# Patient Record
Sex: Female | Born: 1965
Health system: Southern US, Community
[De-identification: ages and names within clinical notes are randomized; demographics above are authoritative.]

## PROBLEM LIST (undated history)

## (undated) DIAGNOSIS — H4020X0 Unspecified primary angle-closure glaucoma, stage unspecified: Secondary | ICD-10-CM

## (undated) DIAGNOSIS — F329 Major depressive disorder, single episode, unspecified: Secondary | ICD-10-CM

## (undated) DIAGNOSIS — F32A Depression, unspecified: Secondary | ICD-10-CM

## (undated) DIAGNOSIS — Z9889 Other specified postprocedural states: Secondary | ICD-10-CM

## (undated) DIAGNOSIS — H40219 Acute angle-closure glaucoma, unspecified eye: Secondary | ICD-10-CM

## (undated) DIAGNOSIS — T7840XA Allergy, unspecified, initial encounter: Secondary | ICD-10-CM

## (undated) DIAGNOSIS — I10 Essential (primary) hypertension: Secondary | ICD-10-CM

## (undated) DIAGNOSIS — R112 Nausea with vomiting, unspecified: Secondary | ICD-10-CM

## (undated) DIAGNOSIS — E039 Hypothyroidism, unspecified: Secondary | ICD-10-CM

## (undated) HISTORY — PX: PARATHYROIDECTOMY: SHX19

## (undated) HISTORY — DX: Allergy, unspecified, initial encounter: T78.40XA

## (undated) HISTORY — DX: Major depressive disorder, single episode, unspecified: F32.9

## (undated) HISTORY — PX: INCISION AND DRAINAGE: SHX5863

## (undated) HISTORY — PX: ORIF FINGER FRACTURE: SHX2122

## (undated) HISTORY — DX: Acute angle-closure glaucoma, unspecified eye: H40.219

## (undated) HISTORY — DX: Essential (primary) hypertension: I10

## (undated) HISTORY — DX: Depression, unspecified: F32.A

## (undated) HISTORY — DX: Hypothyroidism, unspecified: E03.9

## (undated) HISTORY — DX: Unspecified primary angle-closure glaucoma, stage unspecified: H40.20X0

---

## 2008-09-08 ENCOUNTER — Ambulatory Visit (HOSPITAL_COMMUNITY): Admission: RE | Admit: 2008-09-08 | Discharge: 2008-09-08 | Payer: Self-pay | Admitting: Obstetrics and Gynecology

## 2008-09-08 ENCOUNTER — Encounter (INDEPENDENT_AMBULATORY_CARE_PROVIDER_SITE_OTHER): Payer: Self-pay | Admitting: Obstetrics and Gynecology

## 2011-02-06 NOTE — Op Note (Signed)
[name], Jacqueline Finley NO.:  [id number]   MEDICAL RECORD NO.:  [id number]          PATIENT TYPE:  AMB   LOCATION:  SDC                           FACILITY:  [facility]   PHYSICIAN:  Huel Cote, M.D. DATE OF BIRTH:  November 15, 1965   DATE OF PROCEDURE:  DATE OF DISCHARGE:                               OPERATIVE REPORT   PREOPERATIVE DIAGNOSIS:  Menorrhagia.   POSTOPERATIVE DIAGNOSIS:  Menorrhagia.   PROCEDURES:  Hysteroscopy and NovaSure endometrial ablation.   SURGEON:  Huel Cote, MD   ANESTHESIA:  LMA with a 1% plain paracervical lidocaine block.   FINDINGS:  Normal cavity.   SPECIMEN:  Endometrial curettings were sent to pathology, total cavity  width was 3.8, cavity length was 5.0, treatment time was 1 minute and 5  seconds, and a power of 105.   PROCEDURE:  The patient was taken to the operating room where LMA  anesthesia was obtained without difficulty.  She was then prepped and  draped in normal sterile fashion in the dorsal lithotomy position.  A  speculum was placed within the vagina.  Some Cyto-Tek remnants were  noted and removed and the patient's IUD was removed prior to beginning  the procedure.  The uterus was sounded to 10 and the cervix measured  approximately 5 cm with a cavity length of 5, this was verified by the  SureSound given the long cervical length.  The hysteroscope was then  easily introduced into the uterus which distended nicely and a normal  cavity was noted.  Both tubal ostia were clearly visualized and there  was no impediment to proceeding with the NovaSure.  An endometrial  curetting was then performed and the sample sent to pathology, although  the patient was at the conclusion of her period.  Endometrium did appear  rather thin.  The NovaSure was then introduced into the uterus and the  unit deployed, cavity width of 3.8 was noted with manipulation and the  cervical seal was put in place.  The uterine cavity was tested  and  passed and therefore the unit was activated with the treatment time as  stated of 1 minute and 5 seconds at a power of 105, the unit was then  removed and the hysteroscope reintroduced into the uterus.  It appear  that there was a good treatment effect with no viable appearing  endometrium noted either in the cornua or the cavity.  The patient  tolerated the procedure very well and was given 1% plain paracervical  block at the 2 and 10 o'clock position prior to beginning the procedure  to help with postoperative pain relief, as well.  Total of 15 mL were  given.  All instruments and sponges were then removed from the patient's  vagina and she was awakened and taken to the recovery room in good  condition.     Huel Cote, M.D.  Electronically Signed    KR/MEDQ  D:  09/08/2008  T:  09/08/2008  Job:  [id number]

## 2011-02-06 NOTE — H&P (Signed)
NAMERONNICA, DREESE NO.:  [id number]   MEDICAL RECORD NO.:  [id number]          PATIENT TYPE:  AMB   LOCATION:                                [facility]:  [facility]   PHYSICIAN:  Huel Cote, M.D. DATE OF BIRTH:  1966-06-10   DATE OF ADMISSION:  09/08/2008  DATE OF DISCHARGE:                              HISTORY & PHYSICAL   DATE OF SURGERY:  [facility] on September 08, 2008 at 10 o'clock  a.m.   HISTORY OF PRESENT ILLNESS:  The patient is a 45 year old G2, P2 who is  coming in for a scheduled NovaSure hysteroscopy for an ongoing problem  with menorrhagia.  The patient had a previously-placed copper IUD that  has been in since the birth of her last child.  She does note that she  has very heavy periods coming every 20-40 days and lasting 7 days in  duration.  She wishes some definitive therapy for this and is not  interested in oral contraceptives or any medications.   PAST MEDICAL HISTORY:  1. MRSA in a C-section wound infection in 1999/2000.  2. History of GERD with reflux that is stable on Nexium.  3. Most recently, she has been diagnosed with hypothyroidism and is      currently being replaced with Synthroid.   PAST SURGICAL HISTORY:  Significant for the two previous C-sections.  No  abnormal Pap smears in the past.   PAST OBSTETRICAL HISTORY:  Significant for the C-sections in 1999 and  2001.   ALLERGIES:  None.   MEDICATIONS:  1. Effexor.  2. Nexium.  3. The recent addition of Synthroid.   PHYSICAL EXAMINATION:  VITAL SIGNS:  Her weight is 148 pounds, blood  pressure 138/85.  CARDIAC:  Regular rate and rhythm.  LUNGS:  Clear.  ABDOMEN:  Soft and nontender.  PELVIC:  She has normal external genitalia.  Cervix is normal.  IUD  strings are visible.  Uterus is normal in size, and adnexa have no  masses.   The patient was counseled as to all of her options and elects to proceed  with a NovaSure endometrial ablation.  We will remove the  copper IUD at  the time of the procedure, and the patient will have hysteroscopy to  assess if the  endometrial cavity is normal.  She is aware of the risks and benefits of  the procedure including bleeding, infection and possible uterine  perforation.  She will use Cytotec prior to the procedure and hopefully  reduce the risk of perforation.  After all options were discussed with  the patient, she desires to proceed with surgery as stated.      Huel Cote, M.D.  Electronically Signed     KR/MEDQ  D:  09/06/2008  T:  09/06/2008  Job:  [id number]

## 2011-06-29 LAB — CBC
HCT: 36.8 % (ref 36.0–46.0)
MCHC: 33.9 g/dL (ref 30.0–36.0)
MCV: 84.4 fL (ref 78.0–100.0)
RBC: 4.37 MIL/uL (ref 3.87–5.11)
WBC: 4.3 10*3/uL (ref 4.0–10.5)

## 2013-09-24 DIAGNOSIS — H40219 Acute angle-closure glaucoma, unspecified eye: Secondary | ICD-10-CM

## 2013-09-24 HISTORY — DX: Acute angle-closure glaucoma, unspecified eye: H40.219

## 2013-09-28 ENCOUNTER — Encounter (HOSPITAL_COMMUNITY): Payer: Self-pay | Admitting: Emergency Medicine

## 2013-09-28 ENCOUNTER — Emergency Department (HOSPITAL_COMMUNITY)
Admission: EM | Admit: 2013-09-28 | Discharge: 2013-09-28 | Disposition: A | Discharge status: Home / Self Care | Payer: BC Managed Care – PPO | Attending: Emergency Medicine | Admitting: Emergency Medicine

## 2013-09-28 ENCOUNTER — Emergency Department (HOSPITAL_COMMUNITY): Payer: BC Managed Care – PPO

## 2013-09-28 DIAGNOSIS — Z79899 Other long term (current) drug therapy: Secondary | ICD-10-CM | POA: Insufficient documentation

## 2013-09-28 DIAGNOSIS — IMO0002 Reserved for concepts with insufficient information to code with codable children: Secondary | ICD-10-CM | POA: Insufficient documentation

## 2013-09-28 DIAGNOSIS — W208XXA Other cause of strike by thrown, projected or falling object, initial encounter: Secondary | ICD-10-CM | POA: Insufficient documentation

## 2013-09-28 DIAGNOSIS — Y929 Unspecified place or not applicable: Secondary | ICD-10-CM | POA: Insufficient documentation

## 2013-09-28 DIAGNOSIS — Z23 Encounter for immunization: Secondary | ICD-10-CM | POA: Insufficient documentation

## 2013-09-28 DIAGNOSIS — S62604B Fracture of unspecified phalanx of right ring finger, initial encounter for open fracture: Secondary | ICD-10-CM

## 2013-09-28 DIAGNOSIS — Y9301 Activity, walking, marching and hiking: Secondary | ICD-10-CM | POA: Insufficient documentation

## 2013-09-28 DIAGNOSIS — S61219A Laceration without foreign body of unspecified finger without damage to nail, initial encounter: Secondary | ICD-10-CM

## 2013-09-28 DIAGNOSIS — W010XXA Fall on same level from slipping, tripping and stumbling without subsequent striking against object, initial encounter: Secondary | ICD-10-CM | POA: Insufficient documentation

## 2013-09-28 DIAGNOSIS — S61209A Unspecified open wound of unspecified finger without damage to nail, initial encounter: Secondary | ICD-10-CM | POA: Insufficient documentation

## 2013-09-28 MED ORDER — OXYCODONE-ACETAMINOPHEN 5-325 MG PO TABS
1.0000 | ORAL_TABLET | Freq: Once | ORAL | Status: AC
  Administered 2013-09-28: 1 via ORAL
  Filled 2013-09-28: qty 1

## 2013-09-28 MED ORDER — AMOXICILLIN-POT CLAVULANATE 875-125 MG PO TABS: 1.0000 | ORAL_TABLET | Freq: Two times a day (BID) | ORAL | Status: DC

## 2013-09-28 MED ORDER — AMOXICILLIN-POT CLAVULANATE 875-125 MG PO TABS
1.0000 | ORAL_TABLET | Freq: Once | ORAL | Status: AC
  Administered 2013-09-28: 1 via ORAL
  Filled 2013-09-28: qty 1

## 2013-09-28 MED ORDER — TETANUS-DIPHTH-ACELL PERTUSSIS 5-2.5-18.5 LF-MCG/0.5 IM SUSP
0.5000 mL | Freq: Once | INTRAMUSCULAR | Status: AC
  Administered 2013-09-28: 0.5 mL via INTRAMUSCULAR
  Filled 2013-09-28: qty 0.5

## 2013-09-28 NOTE — Discharge Instructions (Signed)
Keep elevated  Take antibiotics  Return to the ED if you have any concerns  Follow-up with hand surgery tomorrow   Finger Fracture Fractures of fingers are breaks in the bones of the fingers. There are many types of fractures. There are different ways of treating these fractures, all of which can be correct. Your caregiver will discuss the best way to treat your fracture. TREATMENT  Finger fractures can be treated with:   Non-reduction - this means the bones are in place. The finger is splinted without changing the positions of the bone pieces. The splint is usually left on for about a week to ten days. This will depend on your fracture and what your caregiver thinks.  Closed reduction - the bones are put back into position without using surgery. The finger is then splinted.  ORIF (open reduction and internal fixation) - the fracture site is opened. Then the bone pieces are fixed into place with pins or some type of hardware. This is seldom required. It depends on the severity of the fracture. Your caregiver will discuss the type of fracture you have and the treatment that will be best for that problem. If surgery is the treatment of choice, the following is information for you to know and also let your caregiver know about prior to surgery. LET YOUR CAREGIVER KNOW ABOUT:  Allergies  Medications taken including herbs, eye drops, over the counter medications, and creams  Use of steroids (by mouth or creams)  Previous problems with anesthetics or Novocaine  Possibility of pregnancy, if this applies  History of blood clots (thrombophlebitis)  History of bleeding or blood problems  Previous surgery  Other health problems AFTER THE PROCEDURE After surgery, you will be taken to the recovery area where a nurse will check your progress. Once you're awake, stable, and taking fluids well, barring other problems you will be allowed to go home. Once home an ice pack applied to your operative  site may help with discomfort and keep the swelling down. HOME CARE INSTRUCTIONS   Follow your caregiver's instructions as to activities, exercises, physical therapy, and driving a car.  Use your finger and exercise as directed.  Only take over-the-counter or prescription medicines for pain, discomfort, or fever as directed by your caregiver. Do not take aspirin until your caregiver OK's it, as this can increase bleeding immediately following surgery.  Stop using ibuprofen if it upsets your stomach. Let your caregiver know about it. SEEK MEDICAL CARE IF:  You have increased bleeding (more than a small spot) from the wound or from beneath your splint.  You develop redness, swelling, or increasing pain in the wound or from beneath your splint.  There is pus coming from the wound or from beneath your splint.  An unexplained oral temperature above 102 F (38.9 C) develops, or as your caregiver suggests.  There is a foul smell coming from the wound or dressing or from beneath your splint. SEEK IMMEDIATE MEDICAL CARE IF:   You develop a rash.  You have difficulty breathing.  You have any allergic problems. MAKE SURE YOU:   Understand these instructions.  Will watch your condition.  Will get help right away if you are not doing well or get worse. Document Released: 12/23/2000 Document Revised: 12/03/2011 Document Reviewed: 04/29/2008 [facility] Patient Information 2014 [place], [place].

## 2013-09-28 NOTE — ED Notes (Signed)
Pt c/o fourth digit on right hand laceration after fall; pt unsure if could be fractured; painful to move

## 2013-09-28 NOTE — ED Provider Notes (Signed)
CSN: [id number]     Arrival date & time 09/28/13  1817 History   First MD Initiated Contact with Patient 09/28/13 1944     This chart was scribed for non-physician practitioner, Vernie Murders PA-C working with Orpah Greek, * by Forrestine Him, ED Scribe. This patient was seen in room TR08C/TR08C and the patient's care was started at 7:55 PM.   Chief Complaint  Patient presents with  . Finger Injury   The history is provided by the patient. No language interpreter was used.    HPI Comments: Jacqueline Finley is a 48 y.o. female who presents to the Emergency Department complaining of a finger injury.  Patient states that prior to arrival she was walking with a crock pot of soup when she slipped and fell with the crock pot falling on her right hand.  She denies any other injuries including head injury or LOC.  She sustained a laceration to her right 4th digit.  Patient took a shower to clean herself from the soup but also cleaned her wound.  She did not take anything for pain PTA.  No numbness, tingling, loss of sensation, or weakness.  She has limitations of ROM due to pain.  Tetanus not up to date.    History reviewed. No pertinent past medical history. History reviewed. No pertinent past surgical history. History reviewed. No pertinent family history. History  Substance Use Topics  . Smoking status: Never Smoker   . Smokeless tobacco: Not on file  . Alcohol Use: Yes     Comment: occ   OB History   Grav Para Term Preterm Abortions TAB SAB Ect Mult Living                 Review of Systems  Constitutional: Negative for diaphoresis.  Respiratory: Negative for shortness of breath.   Cardiovascular: Negative for chest pain and leg swelling.  Gastrointestinal: Negative for nausea, vomiting and abdominal pain.  Musculoskeletal: Negative for back pain and neck pain.  Skin: Positive for wound (Positive for laceration ).  Neurological: Negative for syncope, weakness and headaches.   Psychiatric/Behavioral: Negative for confusion.  All other systems reviewed and are negative.    Allergies  Review of patient's allergies indicates no known allergies.  Home Medications  No current outpatient prescriptions on file.  Triage Vitals: BP 153/105  Pulse 82  Temp(Src) 98.4 F (36.9 C) (Oral)  Resp 18  Wt 155 lb (70.308 kg)  SpO2 95%  LMP 09/24/2008  Filed Vitals:   09/28/13 1822 09/28/13 2322  BP: 153/105 130/85  Pulse: 82 75  Temp: 98.4 F (36.9 C) 98 F (36.7 C)  TempSrc: Oral Oral  Resp: 18   Weight: 155 lb (70.308 kg)   SpO2: 95% 100%    Physical Exam  Nursing note and vitals reviewed. Constitutional: She is oriented to person, place, and time. She appears well-developed and well-nourished. No distress.  HENT:  Head: Normocephalic and atraumatic.  Right Ear: External ear normal.  Left Ear: External ear normal.  Nose: Nose normal.  Mouth/Throat: Oropharynx is clear and moist.  No tenderness to the scalp or face throughout. No palpable hematoma, step-offs, or lacerations throughout.     Eyes: Conjunctivae are normal. Right eye exhibits no discharge. Left eye exhibits no discharge.  Neck: Normal range of motion. Neck supple.  No cervical spinal or paraspinal tenderness to palpation throughout.  No limitations with neck ROM.    Cardiovascular: Normal rate, regular rhythm and normal heart sounds.  Exam reveals no gallop and no friction rub.   No murmur heard. Radial pulses present bilaterally.  Capillary refill <2 seconds on digits of right hand.    Pulmonary/Chest: Effort normal and breath sounds normal. No respiratory distress. She has no wheezes. She has no rales. She exhibits no tenderness.  Abdominal: Soft. She exhibits no distension. There is no tenderness.  Musculoskeletal: Normal range of motion. She exhibits edema and tenderness.       Hands: Tenderness to palpation to the right 4th phalanx throughout.  ROM of the PIP and DIP joints of the  right 4th phalanx limited due to pain however is intact.  ROM of the digits 1,2,3, & 5 of the right hand intact.  No tenderness to palpation to the right wrist.     Neurological: She is alert and oriented to person, place, and time.  Sensation intact in the right hand  Skin: Skin is warm and dry. She is not diaphoretic.  2 cm laceration to the lateral proximal and middle phalanx overlying the PIP joint.  Bleeding well controlled.  Surrounding edema.  No ecchymosis.  No penetrating or palpable bone.      ED Course  LACERATION REPAIR Date/Time: 09/28/2013 10:00 PM Performed by: Vernie Murders K Authorized by: Lucila [place] Consent: Verbal consent obtained. Consent given by: patient Patient identity confirmed: verbally with patient Body area: upper extremity Location details: right ring finger Laceration length: 2 cm Foreign bodies: no foreign bodies Tendon involvement: none Nerve involvement: none Vascular damage: no Anesthesia: local infiltration and digital block Local anesthetic: lidocaine 1% without epinephrine Anesthetic total: 6 ml Patient sedated: no Preparation: Patient was prepped and draped in the usual sterile fashion. Irrigation solution: saline Irrigation method: jet lavage Amount of cleaning: standard Debridement: none Degree of undermining: none Skin closure: Ethilon Number of sutures: 3 Technique: simple Approximation: loose Approximation difficulty: simple Dressing: 4x4 sterile gauze Patient tolerance: Patient tolerated the procedure well with no immediate complications. Comments: Ring tourniquet used due to bleeding after irrigation   (including critical care time)  DIAGNOSTIC STUDIES: Oxygen Saturation is 95% on RA, adequate by my interpretation.    COORDINATION OF CARE: 7:55 PM- Will perform laceration repair. Discussed treatment plan with pt at bedside and pt agreed to plan.     Labs Review Labs Reviewed - No data to display Imaging Review Dg  Finger Ring Right  09/28/2013   CLINICAL DATA:  Fall, trauma, finger injury, pain, deformity  EXAM: RIGHT RING FINGER 2+V  COMPARISON:  None  FINDINGS: Osseous mineralization grossly normal.  Comminuted fracture middle phalanx right ring finger, extending into DIP and PIP joints.  Displaced fracture fragment identified at the base of the middle phalanx.  Twin no additional fracture, dislocation or bone destruction.  IMPRESSION: Comminuted displaced intra-articular fracture involving the middle phalanx of the right ring finger, extending into the DIP and PIP joints.   Electronically Signed   By: Lavonia Dana M.D.   On: 09/28/2013 19:09    EKG Interpretation   None           DG Finger Ring Right (Final result)  Result time: 09/28/13 19:09:42    Final result by Rad Results In Interface (09/28/13 19:09:42)    Narrative:   CLINICAL DATA: Fall, trauma, finger injury, pain, deformity  EXAM: RIGHT RING FINGER 2+V  COMPARISON: None  FINDINGS: Osseous mineralization grossly normal.  Comminuted fracture middle phalanx right ring finger, extending into DIP and PIP joints.  Displaced fracture fragment  identified at the base of the middle phalanx.  Twin no additional fracture, dislocation or bone destruction.  IMPRESSION: Comminuted displaced intra-articular fracture involving the middle phalanx of the right ring finger, extending into the DIP and PIP joints.   Electronically Signed By: Lavonia Dana M.D. On: 09/28/2013 19:09         MDM   Jacqueline Finley is a 48 y.o. female female who presents to the Emergency Department complaining of a finger injury.     Consults  9:30 PM = Spoke with Dr. Burney Gauze who states to irrigate wound and apply loose sutures with overlying ulnar gutter splint.  Will see in clinic tomorrow and surgery likely later this week.  Start Augmentin.       Patient found to have a comminuted displaced intra-articular fracture of the middle phalanx of the right  ring finger with an overlying laceration.  Hand surgery consulted.  Laceration repaired.  Tetanus booster given.  Pain well controlled.  Patient placed un ulnar gutter splint and will see hand surgery tomorrow for further evaluation and management.  Patient neurovascularly intact.  Return precautions, discharge instructions, and follow-up was discussed with the patient before discharge.     Discharge Medication List as of 09/28/2013 11:17 PM    START taking these medications   Details  amoxicillin-clavulanate (AUGMENTIN) 875-125 MG per tablet Take 1 tablet by mouth every 12 (twelve) hours., Starting 09/28/2013, Until Discontinued, Print        Final impressions: 1. Fracture of phalanx of right ring finger, open, initial encounter   2. Laceration of finger, right, initial encounter      Mercy Moore PA-C   This patient was discussed with Dr. Doristine Counter, PA-C 09/30/13 1026

## 2013-10-02 NOTE — ED Provider Notes (Signed)
Medical screening examination/treatment/procedure(s) were performed by non-physician practitioner and as supervising physician I was immediately available for consultation/collaboration.  Meghana Tullo J. Nykolas Bacallao, MD 10/02/13 0758 

## 2013-11-15 DIAGNOSIS — H40241 Residual stage of angle-closure glaucoma, right eye: Secondary | ICD-10-CM | POA: Insufficient documentation

## 2014-06-21 ENCOUNTER — Ambulatory Visit: Payer: BC Managed Care – PPO | Admitting: Family

## 2014-10-20 ENCOUNTER — Encounter: Payer: Self-pay | Admitting: Family Medicine

## 2014-10-20 ENCOUNTER — Ambulatory Visit (INDEPENDENT_AMBULATORY_CARE_PROVIDER_SITE_OTHER): Payer: BLUE CROSS/BLUE SHIELD | Admitting: Family Medicine

## 2014-10-20 VITALS — BP 120/82 | HR 69 | Temp 97.7°F | Ht 64.75 in | Wt 169.3 lb

## 2014-10-20 DIAGNOSIS — F329 Major depressive disorder, single episode, unspecified: Secondary | ICD-10-CM

## 2014-10-20 DIAGNOSIS — E039 Hypothyroidism, unspecified: Secondary | ICD-10-CM

## 2014-10-20 DIAGNOSIS — E663 Overweight: Secondary | ICD-10-CM

## 2014-10-20 DIAGNOSIS — R5383 Other fatigue: Secondary | ICD-10-CM

## 2014-10-20 DIAGNOSIS — F32A Depression, unspecified: Secondary | ICD-10-CM | POA: Insufficient documentation

## 2014-10-20 LAB — TSH: TSH: 2.14 u[IU]/mL (ref 0.35–4.50)

## 2014-10-20 MED ORDER — VENLAFAXINE HCL ER 75 MG PO CP24: 75.0000 mg | ORAL_CAPSULE | Freq: Every evening | ORAL | Status: DC | PRN

## 2014-10-20 MED ORDER — VENLAFAXINE HCL ER 150 MG PO CP24: 150.0000 mg | ORAL_CAPSULE | Freq: Every day | ORAL | Status: DC

## 2014-10-20 NOTE — Progress Notes (Signed)
HPI:  Jacqueline Finley is here to establish care. Used to see an endocrinologist in [place] for her hypothyroidism - but now works in [employer] and wants to transfer here. On synthroid 126mg for some time and does ok on this. On cytomel shortly in the past. She feels like thryoid might be a little off - wants to check. She is active - exercises 5-6 days per week -runs, does hot yoga. Has been 2 years since her last thyroid check. Feels like her weight has been more difficult to control.  Last PCP and physical: goes to ob/gyn - goes yearly usually, but did not go this past year.   Has the following chronic problems that require follow up and concerns today:  Hypothyroidism: -no hx of thyroid goiter or ca -on synthroid 1042m, but last check in 2013 -symptoms: fatigue, struggles with weight -[place]takes on empty stomach  GERD: -reflux and heartburn a few times per month -denies: dysphagia, nausea, vomiting -response well to the nexium - TUMs work too -hx gastric ulcer from motrin  Hx glaucoma: -from scopolamine patch -sees Dr. 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 eye  Osteoarthritis: -of hands -takes a lot of motrin for this  Depression: -hx of after a tough period in her life in 2000 -takes Effexor xr 150 bid - above max dose; she if ok with coming down to normal max dose but not below -used to see pzych -doing well, wishes to continue  HM: -has not done colonoscopy  -has not done mammo    ROS negative for unless reported above: fevers, unintentional weight loss, hearing or vision loss, chest pain, palpitations, struggling to breath, hemoptysis, melena, hematochezia, hematuria, falls, loc, si, thoughts of self harm  Past Medical History  Diagnosis Date  . Hypothyroidism   . Angle-closure glaucoma   . Depression     Past Surgical History  Procedure Laterality Date  . Cesarean section    . Incision  and drainage      due to C-section  . Orif finger fracture Right     4th digit    No family history on file.  History   Social History  . Marital Status: Married    Spouse Name: N/A    Number of Children: N/A  . Years of Education: N/A   Social History Main Topics  . Smoking status: Never Smoker   . Smokeless tobacco: None  . Alcohol Use: Yes     Comment: occ  . Drug Use: No  . Sexual Activity: None   Other Topics Concern  . None   Social History Narrative     Current outpatient prescriptions:  .  Probiotic Product (PROBIOTIC PO), Take 1 tablet by mouth daily., Disp: , Rfl:  .  SYNTHROID 100 MCG tablet, Take 1 tablet by mouth daily., Disp: , Rfl:  .  venlafaxine XR (EFFEXOR-XR) 150 MG 24 hr capsule, Take 1 capsule (150 mg total) by mouth daily with breakfast., Disp: 90 capsule, Rfl: 1 .  venlafaxine XR (EFFEXOR-XR) 75 MG 24 hr capsule, Take 1 capsule (75 mg total) by mouth at bedtime as needed., Disp: 90 capsule, Rfl: 1  EXAM:  Filed Vitals:   10/20/14 1130  BP: 120/82  Pulse: 69  Temp: 97.7 F (36.5 C)    Body mass index is 28.38 kg/(m^2).  GENERAL: vitals reviewed and listed above, alert, oriented, appears well hydrated and in no acute distress  HEENT: atraumatic, conjunttiva clear, no obvious abnormalities on inspection of external nose and ears  NECK: no obvious masses on inspection  LUNGS: clear to auscultation bilaterally, no wheezes, rales or rhonchi, good air movement  CV: HRRR, no peripheral edema  MS: moves all extremities without noticeable abnormality  PSYCH: pleasant and cooperative, no obvious depression or anxiety  ASSESSMENT AND PLAN:  Discussed the following assessment and plan:  Hypothyroidism, unspecified hypothyroidism type - Plan: TSH  Overweight  Other fatigue - Plan: CMP with eGFR  Depression - Plan: venlafaxine XR (EFFEXOR-XR) 150 MG 24 hr capsule, venlafaxine XR (EFFEXOR-XR) 75 MG 24 hr capsule  -basic labs per her  request - not fasting so did not do cholesterol -she may see the endocrinologist but we will see where her labs are -advised decreasing her effexor to normal dose and she agreed to this and may see psych if does not tolerate this -We reviewed the PMH, PSH, FH, SH, Meds and Allergies. -We provided refills for any medications we will prescribe as needed. -We addressed current concerns per orders and patient instructions. -We have asked for records for pertinent exams, studies, vaccines and notes from previous providers. -We have advised patient to follow up per instructions below.   -Patient advised to return or notify a doctor immediately if symptoms worsen or persist or new concerns arise.  Patient Instructions  -We have ordered labs or studies at this visit. It can take up to 1-2 weeks for results and processing. We will contact you with instructions IF your results are abnormal. Normal results will be released to your [facility]. If you have not heard from [place] or can not find your results in [facility] in 2 weeks please contact our office.  -PLEASE SIGN UP FOR MYCHART TODAY   We recommend the following healthy lifestyle measures: - eat a healthy diet consisting of lots of vegetables, fruits, beans, nuts, seeds, healthy meats such as white chicken and fish and whole grains.  - avoid fried foods, fast food, processed foods, sodas, red meet and other fattening foods.  - get a least 150 minutes of aerobic exercise per week.   Decrease Effexor down to 150 in morning and 75 at night  Get a mammogram  Call if you decide to do the colonoscopy      Shavanna Furnari R.

## 2014-10-20 NOTE — Progress Notes (Signed)
Pre visit review using our clinic review tool, if applicable. No additional management support is needed unless otherwise documented below in the visit note. 

## 2014-10-20 NOTE — Patient Instructions (Addendum)
-  We have ordered labs or studies at this visit. It can take up to 1-2 weeks for results and processing. We will contact you with instructions IF your results are abnormal. Normal results will be released to your [facility]. If you have not heard from [place] or can not find your results in [facility] in 2 weeks please contact our office.  -PLEASE SIGN UP FOR MYCHART TODAY   We recommend the following healthy lifestyle measures: - eat a healthy diet consisting of lots of vegetables, fruits, beans, nuts, seeds, healthy meats such as white chicken and fish and whole grains.  - avoid fried foods, fast food, processed foods, sodas, red meet and other fattening foods.  - get a least 150 minutes of aerobic exercise per week.   Decrease Effexor down to 150 in morning and 75 at night  Get a mammogram  Call if you decide to do the colonoscopy

## 2014-10-21 LAB — COMPLETE METABOLIC PANEL WITH GFR
ALBUMIN: 4.4 g/dL (ref 3.5–5.2)
ALT: 19 U/L (ref 0–35)
AST: 15 U/L (ref 0–37)
Alkaline Phosphatase: 46 U/L (ref 39–117)
BUN: 17 mg/dL (ref 6–23)
CALCIUM: 11.2 mg/dL — AB (ref 8.4–10.5)
CO2: 25 meq/L (ref 19–32)
CREATININE: 0.54 mg/dL (ref 0.50–1.10)
Chloride: 104 mEq/L (ref 96–112)
GFR, Est African American: >89 mL/min
GLUCOSE: 88 mg/dL (ref 70–99)
POTASSIUM: 5.2 meq/L (ref 3.5–5.3)
Sodium: 138 mEq/L (ref 135–145)
Total Bilirubin: 0.3 mg/dL (ref 0.2–1.2)
Total Protein: 6.9 g/dL (ref 6.0–8.3)

## 2014-10-25 ENCOUNTER — Telehealth: Payer: Self-pay | Admitting: Family Medicine

## 2014-10-25 ENCOUNTER — Other Ambulatory Visit (INDEPENDENT_AMBULATORY_CARE_PROVIDER_SITE_OTHER): Payer: BLUE CROSS/BLUE SHIELD

## 2014-10-25 ENCOUNTER — Other Ambulatory Visit: Payer: Self-pay | Admitting: *Deleted

## 2014-10-25 DIAGNOSIS — E038 Other specified hypothyroidism: Secondary | ICD-10-CM

## 2014-10-25 MED ORDER — LEVOTHYROXINE SODIUM 100 MCG PO TABS: 100.0000 ug | ORAL_TABLET | Freq: Every day | ORAL | Status: DC

## 2014-10-25 NOTE — Telephone Encounter (Addendum)
[phone] (home)  Discussed with pt. She wants to increase synthroid due to fatigue. I called to ask what questions she had and she raised her voice and demanded that I increase her synthroid as she is having difficulty loosing weight. I advised given her elevated calcium would advise finding out what is going with the calcium first. She has reported hx of elevated calcium without eval. She had not see a doctor in several years prior to coming to me. Opted to wait on calcium/ipth results and then likely have her see endocrinologist to assist. She does not want to see Dr. Buddy Duty, she reports wants to see [facility] [facility] endo.

## 2014-10-25 NOTE — Telephone Encounter (Signed)
Pt called to ask if the following rx has been called in  SYNTHROID 100 MCG tablet   Pt said her and Dr Maudie Mercury discussed her medicine being up [address]

## 2014-10-26 ENCOUNTER — Encounter: Payer: Self-pay | Admitting: Family Medicine

## 2014-10-26 ENCOUNTER — Telehealth: Payer: Self-pay | Admitting: Family Medicine

## 2014-10-26 LAB — CALCIUM, IONIZED: Calcium, Ion: 1.5 mmol/L — ABNORMAL HIGH (ref 1.12–1.32)

## 2014-10-26 NOTE — Telephone Encounter (Signed)
Results for the CMP were discussed with pt on 2/1 and released via Mychart today.

## 2014-10-26 NOTE — Telephone Encounter (Signed)
Pt would like blood work results °

## 2014-10-27 ENCOUNTER — Telehealth: Payer: Self-pay | Admitting: Family Medicine

## 2014-10-27 LAB — PTH, INTACT AND CALCIUM
Calcium: 10.5 mg/dL (ref 8.4–10.5)
PTH: 79 pg/mL — AB (ref 14–64)

## 2014-10-27 NOTE — Telephone Encounter (Signed)
Waiting on ipth - Jacqueline Finley anee can you check with lab on how long this takes and let pt know. Thanks. If she would like referral to endocrine in interim please place referral.

## 2014-10-27 NOTE — Telephone Encounter (Signed)
Pt called to say that the wrong information was sent over to [facility]. She is requesting that Dr Maudie Mercury give her a call . She said she is not going to [facility] for the reason that was submitted

## 2014-10-27 NOTE — Telephone Encounter (Signed)
I called the pt and she was given the results.

## 2014-10-27 NOTE — Telephone Encounter (Signed)
Pt would like pth and calcium results

## 2014-10-27 NOTE — Telephone Encounter (Signed)
Patient informed of the results

## 2014-10-28 NOTE — Telephone Encounter (Signed)
Called patient and LMOVM for her. I explained that the two dx submitted with her referral will stand as that is what Dr Maudie Mercury has dx'd. To further expound on her condition she will need to proceed with her appointment at [facility].

## 2014-10-28 NOTE — Telephone Encounter (Addendum)
Per my assistant pt has been on the phone with referral coordinator for excessive amount of time, demanded to see one specialist at [facility], but was not happy would have to wait to see that specialist, so they scheduled her with another provider at that clinic. Thinks if we change dx will change referral. Per my referral coordinator, she called the clinic and they advised appt per what is scheduled and advised pt can call and make changes in terms of provider or time of appt if needed and per referral coordinator pt informed of this. Please advise her hypercalcemia and hypothyroidism - changing dx will not likely change referral. Endocrinologist can confirm if has hyperparathyroidism or familial hypercalcemia and assist her in treatment.

## 2014-10-28 NOTE — Telephone Encounter (Signed)
Patient requests Dr Maudie Mercury call her as she states she needs to see an endocrinologist for hyperparathyroidism.

## 2014-10-28 NOTE — Telephone Encounter (Signed)
I am not sure what the question is. Can you please find out from Jacqueline Finley. Looks like appt scheduled. I thought she was going to see endo here?

## 2014-11-03 ENCOUNTER — Telehealth: Payer: Self-pay | Admitting: Family Medicine

## 2014-11-03 DIAGNOSIS — IMO0002 Reserved for concepts with insufficient information to code with codable children: Secondary | ICD-10-CM | POA: Insufficient documentation

## 2014-11-03 NOTE — Telephone Encounter (Signed)
Plan that we discussed and is on her patient instructions is 150 in the morning and 75 in the evening (max dose for her dx.). These were both sent [facility] at her visit. Please let her know and check with pharmacy if they did not get the rx.

## 2014-11-03 NOTE — Telephone Encounter (Signed)
I called the pharmacy and spoke with [facility] and she stated they did not receive the Rx that was sent in on 1/27 so I called this in to her and the pt was informed.

## 2014-11-03 NOTE — Telephone Encounter (Signed)
Pt called to say that this med was called in  venlafaxine XR (EFFEXOR-XR) 150 MG 24 hr capsule she said she is taking 225    So see need the following called in  venlafaxine XR (EFFEXOR-XR) 75 MG 24 hr capsule    Pharmacy  [facility] [address]

## 2014-11-04 DIAGNOSIS — E210 Primary hyperparathyroidism: Secondary | ICD-10-CM | POA: Insufficient documentation

## 2014-11-05 ENCOUNTER — Telehealth: Payer: Self-pay | Admitting: Family Medicine

## 2014-11-05 NOTE — Telephone Encounter (Signed)
I left a message for the pt to return my call. 

## 2014-11-05 NOTE — Telephone Encounter (Signed)
Advise the urine be done at [facility] as our lab can't take outside orders. Thank you.

## 2014-11-05 NOTE — Telephone Encounter (Signed)
Rx from 1/27 re-called in to [facility] and this was left on the voicemail and I called the pt and informed her of this.  She also stated the specialist from [place] ordered a 24-hour urine and wanted to know if she could have this done here?

## 2014-11-05 NOTE — Telephone Encounter (Signed)
Pt called to say that she did not get the correct rx she said they gave her regular instead of the following  venlafaxine XR (EFFEXOR-XR) 75 MG 24 hr capsule  She asked that the above one be called in

## 2014-11-09 ENCOUNTER — Other Ambulatory Visit: Payer: Self-pay | Admitting: Endocrinology

## 2014-11-09 ENCOUNTER — Ambulatory Visit
Admission: RE | Admit: 2014-11-09 | Discharge: 2014-11-09 | Disposition: A | Discharge status: Home / Self Care | Payer: BLUE CROSS/BLUE SHIELD | Source: Ambulatory Visit | Attending: Endocrinology | Admitting: Endocrinology

## 2014-11-09 DIAGNOSIS — R109 Unspecified abdominal pain: Secondary | ICD-10-CM

## 2014-11-09 DIAGNOSIS — E209 Hypoparathyroidism, unspecified: Secondary | ICD-10-CM

## 2014-11-10 NOTE — Telephone Encounter (Signed)
Left a message for the pt to return my call.  

## 2014-11-15 ENCOUNTER — Ambulatory Visit
Admission: RE | Admit: 2014-11-15 | Discharge: 2014-11-15 | Disposition: A | Discharge status: Home / Self Care | Payer: BLUE CROSS/BLUE SHIELD | Source: Ambulatory Visit | Attending: Endocrinology | Admitting: Endocrinology

## 2014-11-15 DIAGNOSIS — E209 Hypoparathyroidism, unspecified: Secondary | ICD-10-CM

## 2015-01-20 DIAGNOSIS — E892 Postprocedural hypoparathyroidism: Secondary | ICD-10-CM | POA: Insufficient documentation

## 2015-01-20 DIAGNOSIS — Z9889 Other specified postprocedural states: Secondary | ICD-10-CM | POA: Insufficient documentation

## 2015-11-24 ENCOUNTER — Other Ambulatory Visit: Payer: Self-pay | Admitting: Gynecology

## 2015-11-24 DIAGNOSIS — R928 Other abnormal and inconclusive findings on diagnostic imaging of breast: Secondary | ICD-10-CM

## 2015-12-02 ENCOUNTER — Ambulatory Visit
Admission: RE | Admit: 2015-12-02 | Discharge: 2015-12-02 | Disposition: A | Discharge status: Home / Self Care | Payer: BLUE CROSS/BLUE SHIELD | Source: Ambulatory Visit | Attending: Gynecology | Admitting: Gynecology

## 2015-12-02 DIAGNOSIS — R928 Other abnormal and inconclusive findings on diagnostic imaging of breast: Secondary | ICD-10-CM

## 2017-02-04 MED FILL — SYNTHROID 112 MCG TABLET: 112 | 90 days supply | Qty: 90 | Fill #0

## 2017-02-04 MED FILL — VENLAFAXINE HCL ER 150 MG C: 150 | 30 days supply | Qty: 60 | Fill #0

## 2017-02-04 MED FILL — HYDROCHLOROTHIAZIDE 12.5 MG: 12.5 | 90 days supply | Qty: 90 | Fill #0

## 2017-03-25 MED FILL — VENLAFAXINE HCL ER 150 MG C: 150 | 30 days supply | Qty: 60 | Fill #1

## 2017-04-25 ENCOUNTER — Encounter: Payer: Self-pay | Admitting: Emergency Medicine

## 2017-04-25 ENCOUNTER — Telehealth: Payer: Self-pay | Admitting: Emergency Medicine

## 2017-04-25 NOTE — Telephone Encounter (Signed)
PreVisit call attempted left voicemail for pt to return call to office.

## 2017-04-25 NOTE — Telephone Encounter (Signed)
Pre Visit call completed. 

## 2017-04-26 ENCOUNTER — Ambulatory Visit (INDEPENDENT_AMBULATORY_CARE_PROVIDER_SITE_OTHER): Payer: 59 | Admitting: Family Medicine

## 2017-04-26 ENCOUNTER — Encounter: Payer: Self-pay | Admitting: Family Medicine

## 2017-04-26 ENCOUNTER — Ambulatory Visit: Payer: BLUE CROSS/BLUE SHIELD | Admitting: Family Medicine

## 2017-04-26 VITALS — BP 128/86 | Temp 97.6°F | Ht 64.75 in | Wt 156.4 lb

## 2017-04-26 DIAGNOSIS — Z13220 Encounter for screening for lipoid disorders: Secondary | ICD-10-CM | POA: Diagnosis not present

## 2017-04-26 DIAGNOSIS — E538 Deficiency of other specified B group vitamins: Secondary | ICD-10-CM

## 2017-04-26 DIAGNOSIS — E210 Primary hyperparathyroidism: Secondary | ICD-10-CM

## 2017-04-26 DIAGNOSIS — E039 Hypothyroidism, unspecified: Secondary | ICD-10-CM | POA: Diagnosis not present

## 2017-04-26 DIAGNOSIS — E559 Vitamin D deficiency, unspecified: Secondary | ICD-10-CM

## 2017-04-26 DIAGNOSIS — R5383 Other fatigue: Secondary | ICD-10-CM | POA: Diagnosis not present

## 2017-04-26 LAB — COMPREHENSIVE METABOLIC PANEL
ALT: 19 U/L (ref 0–35)
AST: 18 U/L (ref 0–37)
Albumin: 4.1 g/dL (ref 3.5–5.2)
Alkaline Phosphatase: 37 U/L — ABNORMAL LOW (ref 39–117)
BUN: 14 mg/dL (ref 6–23)
CO2: 24 mEq/L (ref 19–32)
Calcium: 9.1 mg/dL (ref 8.4–10.5)
Chloride: 106 mEq/L (ref 96–112)
Creatinine, Ser: 0.54 mg/dL (ref 0.40–1.20)
GFR: 125.42 mL/min (ref >60.00)
Glucose, Bld: 132 mg/dL — ABNORMAL HIGH (ref 70–99)
Potassium: 4.2 mEq/L (ref 3.5–5.1)
Sodium: 137 mEq/L (ref 135–145)
Total Bilirubin: 0.4 mg/dL (ref 0.2–1.2)
Total Protein: 6.8 g/dL (ref 6.0–8.3)

## 2017-04-26 LAB — CBC WITH DIFFERENTIAL/PLATELET
Basophils Absolute: 0 10*3/uL (ref 0.0–0.1)
Basophils Relative: 0.9 % (ref 0.0–3.0)
Eosinophils Absolute: 0.1 10*3/uL (ref 0.0–0.7)
Eosinophils Relative: 1.6 % (ref 0.0–5.0)
HCT: 42 % (ref 36.0–46.0)
Hemoglobin: 14.1 g/dL (ref 12.0–15.0)
Lymphocytes Relative: 36 % (ref 12.0–46.0)
Lymphs Abs: 1.5 10*3/uL (ref 0.7–4.0)
MCHC: 33.5 g/dL (ref 30.0–36.0)
MCV: 85.6 fl (ref 78.0–100.0)
Monocytes Absolute: 0.4 10*3/uL (ref 0.1–1.0)
Monocytes Relative: 8.7 % (ref 3.0–12.0)
Neutro Abs: 2.3 10*3/uL (ref 1.4–7.7)
Neutrophils Relative %: 52.8 % (ref 43.0–77.0)
Platelets: 343 10*3/uL (ref 150.0–400.0)
RBC: 4.91 Mil/uL (ref 3.87–5.11)
RDW: 13.2 % (ref 11.5–15.5)
WBC: 4.3 10*3/uL (ref 4.0–10.5)

## 2017-04-26 LAB — VITAMIN B12: Vitamin B-12: 585 pg/mL (ref 211–911)

## 2017-04-26 LAB — T4, FREE: Free T4: 0.88 ng/dL (ref 0.60–1.60)

## 2017-04-26 LAB — VITAMIN D 25 HYDROXY (VIT D DEFICIENCY, FRACTURES): VITD: 47.13 ng/mL (ref 30.00–100.00)

## 2017-04-26 LAB — TSH: TSH: 1.03 u[IU]/mL (ref 0.35–4.50)

## 2017-04-26 LAB — T3, FREE: T3, Free: 3.4 pg/mL (ref 2.3–4.2)

## 2017-04-26 MED FILL — VENLAFAXINE HCL ER 150 MG C: 150 | 30 days supply | Qty: 60 | Fill #2

## 2017-04-26 NOTE — Progress Notes (Signed)
Jacqueline Finley is a 51 y.o. female is here to Jacqueline Finley.   History of Present Illness:   Jacqueline Finley CMA acting as scribe for Dr. Juleen Finley.  HPI: Patient comes in today to establish care with doctor Jacqueline Finley. 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. She is needing labs for her thyroid. She has had surgery for nodules on the parathyroid.   Stress: Patient is under a lot of stress due to her sons. One of her sons is supposed to go to rehab today, but did not come home.   Depression: Hx of the same and on Effexor at high dose for many years. Originally prescribed by PCP and then increased by Psychiatry. Wants to remain on that dose.   Health Maintenance Due  Topic Date Due  . Hepatitis C Screening  06-14-1966  . HIV Screening  02/15/1979  . MAMMOGRAM  02/14/2014  . COLONOSCOPY  02/14/2014  . PAP SMEAR  07/25/2016  . INFLUENZA VACCINE  04/24/2017   PMHx, SurgHx, SocialHx, Medications, and Allergies were reviewed in the Visit Navigator and updated as appropriate.   Past Medical History:  Diagnosis Date  . Acute angle-closure glaucoma 2015  . Angle-closure glaucoma   . Depression   . Hypothyroidism    Past Surgical History:  Procedure Laterality Date  . CESAREAN SECTION    . INCISION AND DRAINAGE     due to C-section  . ORIF FINGER FRACTURE Right    4th digit   No family history on file. Social History  Substance Use Topics  . Smoking status: Never Smoker  . Smokeless tobacco: Never Used  . Alcohol use Yes     Comment: occ   Current Medications and Allergies:   .  fluticasone (FLONASE) 50 MCG/ACT nasal spray, Place into the nose., Disp: , Rfl:  .  hydrochlorothiazide (MICROZIDE) 12.5 MG capsule, take 1 capsule by mouth once daily, Disp: , Rfl:  .  ibuprofen (ADVIL,MOTRIN) 200 MG tablet, Take by mouth., Disp: , Rfl:  .  levothyroxine (SYNTHROID) 112 MCG tablet, take 1 tablet by mouth every morning ON AN EMPTY STOMACH WITH A GLASS OF WATER AT LEAST 30 TO 60 MINUTES  BEFORE BREAKFAST, Disp: , Rfl:  .  venlafaxine XR (EFFEXOR-XR) 150 MG 24 hr capsule, Take by mouth., Disp: , Rfl:   Allergies  Allergen Reactions  . Morphine Nausea Only  . Scopolamine Other (See Comments)    Accute angle closure glaucoma   Review of Systems:   Pertinent items are noted in the HPI. Otherwise, ROS is negative.  Vitals:   Vitals:   04/26/17 0925  BP: 128/86  Temp: 97.6 F (36.4 C)  TempSrc: Oral  Weight: 156 lb 6.4 oz (70.9 kg)  Height: 5' 4.75" (1.645 m)     Body mass index is 26.23 kg/m.  Physical Exam:   Physical Exam  Constitutional: She appears well-developed and well-nourished. No distress.  HENT:  Head: Normocephalic and atraumatic.  Eyes: Pupils are equal, round, and reactive to light. EOM are normal.  Neck: Normal range of motion. Neck supple.  Cardiovascular: Normal rate, regular rhythm, normal heart sounds and intact distal pulses.   Pulmonary/Chest: Effort normal.  Abdominal: Soft.  Skin: Skin is warm.  Psychiatric: She has a normal mood and affect. Her behavior is normal.  Nursing note and vitals reviewed.  Results for orders placed or performed in visit on 04/26/17  CBC with Differential/Platelet  Result Value Ref Range   WBC 4.3 4.0 - 10.5  K/uL   RBC 4.91 3.87 - 5.11 Mil/uL   Hemoglobin 14.1 12.0 - 15.0 g/dL   HCT 42.0 36.0 - 46.0 %   MCV 85.6 78.0 - 100.0 fl   MCHC 33.5 30.0 - 36.0 g/dL   RDW 13.2 11.5 - 15.5 %   Platelets 343.0 150.0 - 400.0 K/uL   Neutrophils Relative % 52.8 43.0 - 77.0 %   Lymphocytes Relative 36.0 12.0 - 46.0 %   Monocytes Relative 8.7 3.0 - 12.0 %   Eosinophils Relative 1.6 0.0 - 5.0 %   Basophils Relative 0.9 0.0 - 3.0 %   Neutro Abs 2.3 1.4 - 7.7 K/uL   Lymphs Abs 1.5 0.7 - 4.0 K/uL   Monocytes Absolute 0.4 0.1 - 1.0 K/uL   Eosinophils Absolute 0.1 0.0 - 0.7 K/uL   Basophils Absolute 0.0 0.0 - 0.1 K/uL  Comprehensive metabolic panel  Result Value Ref Range   Sodium 137 135 - 145 mEq/L   Potassium  4.2 3.5 - 5.1 mEq/L   Chloride 106 96 - 112 mEq/L   CO2 24 19 - 32 mEq/L   Glucose, Bld 132 (H) 70 - 99 mg/dL   BUN 14 6 - 23 mg/dL   Creatinine, Ser 0.54 0.40 - 1.20 mg/dL   Total Bilirubin 0.4 0.2 - 1.2 mg/dL   Alkaline Phosphatase 37 (L) 39 - 117 U/L   AST 18 0 - 37 U/L   ALT 19 0 - 35 U/L   Total Protein 6.8 6.0 - 8.3 g/dL   Albumin 4.1 3.5 - 5.2 g/dL   Calcium 9.1 8.4 - 10.5 mg/dL   GFR 125.42 >60.00 mL/min  T4, free  Result Value Ref Range   Free T4 0.88 0.60 - 1.60 ng/dL  TSH  Result Value Ref Range   TSH 1.03 0.35 - 4.50 uIU/mL  Vitamin B12  Result Value Ref Range   Vitamin B-12 585 211 - 911 pg/mL  VITAMIN D 25 Hydroxy (Vit-D Deficiency, Fractures)  Result Value Ref Range   VITD 47.13 30.00 - 100.00 ng/mL  T3, free  Result Value Ref Range   T3, Free 3.4 2.3 - 4.2 pg/mL   Assessment and Plan:   Arbadella was seen today for establish care.  Diagnoses and all orders for this visit:  Acquired hypothyroidism Comments: Well controlled.  No signs of complications, medication side effects, or red flags.  Continue current regimen.   Orders: -     T4, free -     TSH -     T3, free  Fatigue, unspecified type -     CBC with Differential/Platelet -     Comprehensive metabolic panel -     Vitamin B12  Primary hyperparathyroidism (HCC) -     PTH, Intact and Calcium  B12 deficiency -     Vitamin B12  Vitamin D deficiency -     VITAMIN D 25 Hydroxy (Vit-D Deficiency, Fractures)  Screening for lipid disorders -     Lipid panel; Future    . Reviewed expectations re: course of current medical issues. . Discussed self-management of symptoms. . Outlined signs and symptoms indicating need for more acute intervention. . Patient verbalized understanding and all questions were answered. Marland Kitchen Health Maintenance issues including appropriate healthy diet, exercise, and smoking avoidance were discussed with patient. . See orders for this visit as documented in the electronic  medical record. . Patient received an After Visit Summary.  CMA served as Education administrator during this visit. History, Physical, and  Plan performed by medical provider. The above documentation has been reviewed and is accurate and complete. Briscoe Deutscher, D.O.  Briscoe Deutscher, DO [place], [facility] 04/27/2017

## 2017-04-29 LAB — PTH, INTACT AND CALCIUM
Calcium: 9.3 mg/dL (ref 8.6–10.4)
PTH: 13 pg/mL — ABNORMAL LOW (ref 14–64)

## 2017-05-08 ENCOUNTER — Telehealth: Payer: Self-pay | Admitting: Family Medicine

## 2017-05-08 NOTE — Telephone Encounter (Signed)
ROI fax to [facility] & Dr. Raelyn Mora

## 2017-05-24 MED FILL — VENLAFAXINE HCL ER 150 MG C: 150 | 30 days supply | Qty: 60 | Fill #3

## 2017-05-24 MED FILL — HYDROCHLOROTHIAZIDE 12.5 MG: 12.5 | 90 days supply | Qty: 90 | Fill #1

## 2017-05-24 MED FILL — SYNTHROID 112 MCG TABLET: 112 | 90 days supply | Qty: 90 | Fill #1

## 2017-06-28 MED FILL — VENLAFAXINE HCL ER 150 MG C: 150 | 30 days supply | Qty: 60 | Fill #4

## 2017-07-30 MED FILL — VENLAFAXINE HCL ER 150 MG C: 150 | 30 days supply | Qty: 60 | Fill #5

## 2017-08-02 DIAGNOSIS — H40241 Residual stage of angle-closure glaucoma, right eye: Secondary | ICD-10-CM | POA: Diagnosis not present

## 2017-08-02 DIAGNOSIS — H26491 Other secondary cataract, right eye: Secondary | ICD-10-CM | POA: Diagnosis not present

## 2017-08-02 DIAGNOSIS — H5310 Unspecified subjective visual disturbances: Secondary | ICD-10-CM | POA: Diagnosis not present

## 2017-08-26 MED FILL — SYNTHROID 112 MCG TABLET: 112 | 30 days supply | Qty: 30 | Fill #0

## 2017-08-29 MED FILL — VENLAFAXINE HCL ER 150 MG C: 150 | 90 days supply | Qty: 180 | Fill #0

## 2017-09-04 ENCOUNTER — Encounter: Payer: Self-pay | Admitting: Family Medicine

## 2017-09-04 ENCOUNTER — Telehealth: Payer: 59 | Admitting: Family

## 2017-09-04 DIAGNOSIS — L309 Dermatitis, unspecified: Secondary | ICD-10-CM

## 2017-09-04 MED ORDER — TRIAMCINOLONE ACETONIDE 0.025 % EX OINT: 1.0000 "application " | TOPICAL_OINTMENT | Freq: Two times a day (BID) | CUTANEOUS | 0 refills | Status: DC

## 2017-09-04 MED FILL — TRIAMCINOLONE 0.025% OINT: 0.025 | 10 days supply | Qty: 80 | Fill #0

## 2017-09-04 NOTE — Progress Notes (Signed)
E Visit for Rash  We are sorry that you are not feeling well. Here is how we plan to help!  Based on what you shared with me it looks like you have dermatitis.  Dermatitis is a skin rash causes irritation or inflammation.  Your skin may be red, swollen, dry, cracked, and itch.   If you get a rash, it's important to figure out what caused it so the irritant can be avoided in the future.   HOME CARE:   Take cool showers and avoid direct sunlight.  Apply cool compress or wet dressings.  Take a bath in an oatmeal bath.  Sprinkle content of one Aveeno packet under running faucet with comfortably warm water.  Bathe for 15-20 minutes, 1-2 times daily.  Pat dry with a towel. Do not rub the rash.  Use hydrocortisone cream.  Take an antihistamine like Benadryl for widespread rashes that itch.  The adult dose of Benadryl is 25-50 mg by mouth 4 times daily.  Caution:  This type of medication may cause sleepiness.  Do not drink alcohol, drive, or operate dangerous machinery while taking antihistamines.  Do not take these medications if you have prostate enlargement.  Read package instructions thoroughly on all medications that you take.  GET HELP RIGHT AWAY IF:   Symptoms don't go away after treatment.  Severe itching that persists.  If you rash spreads or swells.  If you rash begins to smell.  If it blisters and opens or develops a yellow-brown crust.  You develop a fever.  You have a sore throat.  You become short of breath.  MAKE SURE YOU:  Understand these instructions. Will watch your condition. Will get help right away if you are not doing well or get worse.  Thank you for choosing an e-visit. Your e-visit answers were reviewed by a board certified advanced clinical practitioner to complete your personal care plan. Depending upon the condition, your plan could have included both over the counter or prescription medications. Please review your pharmacy choice. Be sure that  the pharmacy you have chosen is open so that you can pick up your prescription now.  If there is a problem you may message your provider in [place] to have the prescription routed to another pharmacy. Your safety is important to [place]. If you have drug allergies check your prescription carefully.  For the next 24 hours, you can use MyChart to ask questions about today's visit, request a non-urgent call back, or ask for a work or school excuse from your e-visit provider. You will get an email in the next two days asking about your experience. I hope that your e-visit has been valuable and will speed your recovery.

## 2017-09-06 ENCOUNTER — Other Ambulatory Visit: Payer: Self-pay | Admitting: Surgical

## 2017-09-06 DIAGNOSIS — H40241 Residual stage of angle-closure glaucoma, right eye: Secondary | ICD-10-CM | POA: Diagnosis not present

## 2017-09-06 MED ORDER — LEVOTHYROXINE SODIUM 112 MCG PO TABS: ORAL_TABLET | ORAL | 3 refills | Status: DC

## 2017-09-25 MED FILL — SYNTHROID 112 MCG TABLET: 112 | 30 days supply | Qty: 30 | Fill #1

## 2017-10-11 DIAGNOSIS — Z01419 Encounter for gynecological examination (general) (routine) without abnormal findings: Secondary | ICD-10-CM | POA: Diagnosis not present

## 2017-10-11 DIAGNOSIS — Z1389 Encounter for screening for other disorder: Secondary | ICD-10-CM | POA: Diagnosis not present

## 2017-10-11 DIAGNOSIS — Z1231 Encounter for screening mammogram for malignant neoplasm of breast: Secondary | ICD-10-CM | POA: Diagnosis not present

## 2017-10-11 DIAGNOSIS — N951 Menopausal and female climacteric states: Secondary | ICD-10-CM | POA: Diagnosis not present

## 2017-10-11 DIAGNOSIS — Z130 Encounter for screening for diseases of the blood and blood-forming organs and certain disorders involving the immune mechanism: Secondary | ICD-10-CM | POA: Diagnosis not present

## 2017-10-18 ENCOUNTER — Ambulatory Visit (INDEPENDENT_AMBULATORY_CARE_PROVIDER_SITE_OTHER): Payer: 59 | Admitting: Family Medicine

## 2017-10-18 ENCOUNTER — Encounter: Payer: Self-pay | Admitting: Family Medicine

## 2017-10-18 VITALS — BP 130/84 | HR 78 | Temp 97.9°F | Wt 160.2 lb

## 2017-10-18 DIAGNOSIS — E039 Hypothyroidism, unspecified: Secondary | ICD-10-CM

## 2017-10-18 DIAGNOSIS — Z0000 Encounter for general adult medical examination without abnormal findings: Secondary | ICD-10-CM | POA: Diagnosis not present

## 2017-10-18 DIAGNOSIS — E559 Vitamin D deficiency, unspecified: Secondary | ICD-10-CM | POA: Diagnosis not present

## 2017-10-18 LAB — TSH: TSH: 1.14 u[IU]/mL (ref 0.35–4.50)

## 2017-10-18 LAB — LIPID PANEL
Cholesterol: 212 mg/dL — ABNORMAL HIGH (ref 0–200)
HDL: 58.7 mg/dL (ref >39.00)
LDL Cholesterol: 135 mg/dL — ABNORMAL HIGH (ref 0–99)
NonHDL: 153.36
Total CHOL/HDL Ratio: 4
Triglycerides: 91 mg/dL (ref 0.0–149.0)
VLDL: 18.2 mg/dL (ref 0.0–40.0)

## 2017-10-18 LAB — VITAMIN D 25 HYDROXY (VIT D DEFICIENCY, FRACTURES): VITD: 33 ng/mL (ref 30.00–100.00)

## 2017-10-18 LAB — T4, FREE: Free T4: 1 ng/dL (ref 0.60–1.60)

## 2017-10-18 MED ORDER — HYDROCHLOROTHIAZIDE 12.5 MG PO CAPS: 12.5000 mg | ORAL_CAPSULE | Freq: Every day | ORAL | 3 refills | Status: DC

## 2017-10-18 MED ORDER — VENLAFAXINE HCL ER 150 MG PO CP24: 150.0000 mg | ORAL_CAPSULE | Freq: Two times a day (BID) | ORAL | 3 refills | Status: DC

## 2017-10-18 MED ORDER — LEVOTHYROXINE SODIUM 112 MCG PO TABS: ORAL_TABLET | ORAL | 3 refills | Status: DC

## 2017-10-18 MED FILL — HYDROCHLOROTHIAZIDE 12.5 MG: 12.5 | 90 days supply | Qty: 90 | Fill #0

## 2017-10-18 NOTE — Progress Notes (Signed)
Subjective:    Jacqueline Finley is a 52 y.o. female who presents today for her Complete Annual Exam. She feels well. She reports exercising regularly. She reports she is sleeping fairly well.   Health Maintenance Due  Topic Date Due  . Hepatitis C Screening  07/19/1966  . HIV Screening  02/15/1979  . MAMMOGRAM  02/14/2014  . COLONOSCOPY  02/14/2014  . PAP SMEAR  07/25/2016   PMHx, SurgHx, SocialHx, Medications, and Allergies were reviewed in the Visit Navigator and updated as appropriate.   Past Medical History:  Diagnosis Date  . Acute angle-closure glaucoma 2015  . Angle-closure glaucoma   . Depression   . Hypothyroidism    Past Surgical History:  Procedure Laterality Date  . CESAREAN SECTION    . INCISION AND DRAINAGE     due to C-section  . ORIF FINGER FRACTURE Right    4th digit   History reviewed. No pertinent family history.   Social History   Tobacco Use  . Smoking status: Never Smoker  . Smokeless tobacco: Never Used  Substance Use Topics  . Alcohol use: Yes    Comment: occ  . Drug use: No   Review of Systems:   Pertinent items are noted in the HPI. Otherwise, ROS is negative.  Objective:   Vitals:   10/18/17 0901  BP: 130/84  Pulse: 78  Temp: 97.9 F (36.6 C)  SpO2: 96%   Body mass index is 26.86 kg/m.  General Appearance:  Alert, cooperative, no distress, appears stated age  Head:  Normocephalic, without obvious abnormality, atraumatic  Eyes:  PERRL, conjunctiva/corneas clear, EOM's intact, fundi benign, both eyes       Ears:  Normal TM's and external ear canals, both ears  Nose: Nares normal, septum midline, mucosa normal, no drainage    or sinus tenderness  Throat: Lips, mucosa, and tongue normal; teeth and gums normal  Neck: Supple, symmetrical, trachea midline, no adenopathy; thyroid:  No enlargement/tenderness/nodules; no carotit bruit or JVD  Back:   Symmetric, no curvature, ROM normal, no CVA tenderness  Lungs:   Clear to  auscultation bilaterally, respirations unlabored  Chest wall:  No tenderness or deformity  Heart:  Regular rate and rhythm, S1 and S2 normal, no murmur, rub   or gallop  Abdomen:   Soft, non-tender, bowel sounds active all four quadrants, no masses, no organomegaly  Extremities: Extremities normal, atraumatic, no cyanosis or edema  Skin: Skin color, texture, turgor normal, no rashes or lesions  Lymph nodes: Cervical, supraclavicular, and axillary nodes normal  Neurologic: CNII-XII grossly intact. Normal strength, sensation and reflexes throughout   Assessment/Plan:   Jacqueline Finley was seen today for annual exam.  Diagnoses and all orders for this visit:  Routine physical examination  Vitamin D deficiency -     VITAMIN D 25 Hydroxy (Vit-D Deficiency, Fractures)  Acquired hypothyroidism -     T4, free -     TSH -     Lipid panel  Other orders -     levothyroxine (SYNTHROID) 112 MCG tablet; take 1 tablet by mouth every morning ON AN EMPTY STOMACH WITH A GLASS OF WATER AT LEAST 30 TO 60 MINUTES BEFORE BREAKFAST -     hydrochlorothiazide (MICROZIDE) 12.5 MG capsule; Take 1 capsule (12.5 mg total) by mouth daily. -     venlafaxine XR (EFFEXOR-XR) 150 MG 24 hr capsule; Take 1 capsule (150 mg total) by mouth 2 (two) times daily.   Patient Counseling: [x]   Nutrition: Stressed  importance of moderation in sodium/caffeine intake, saturated fat and cholesterol, caloric balance, sufficient intake of fresh fruits, vegetables, and fiber.  [x]   Stressed the importance of regular exercise.   []   Substance Abuse: Discussed cessation/primary prevention of tobacco, alcohol, or other drug use; driving or other dangerous activities under the influence; availability of treatment for abuse.   [x]   Injury prevention: Discussed safety belts, safety helmets, smoke detector, smoking near bedding or upholstery.   []   Sexuality: Discussed sexually transmitted diseases, partner selection, use of condoms, avoidance of  unintended pregnancy and contraceptive alternatives.   [x]   Dental health: Discussed importance of regular tooth brushing, flossing, and dental visits.  [x]   Health maintenance and immunizations reviewed. Please refer to Health maintenance section.   Jacqueline Deutscher, DO [place]

## 2017-10-21 ENCOUNTER — Other Ambulatory Visit: Payer: Self-pay | Admitting: Obstetrics and Gynecology

## 2017-10-21 DIAGNOSIS — N6489 Other specified disorders of breast: Secondary | ICD-10-CM

## 2017-10-22 MED FILL — SYNTHROID 112 MCG TABLET: 112 | 90 days supply | Qty: 90 | Fill #0

## 2017-11-22 ENCOUNTER — Ambulatory Visit
Admission: RE | Admit: 2017-11-22 | Discharge: 2017-11-22 | Disposition: A | Discharge status: Home / Self Care | Payer: 59 | Source: Ambulatory Visit | Attending: Obstetrics and Gynecology | Admitting: Obstetrics and Gynecology

## 2017-11-22 ENCOUNTER — Other Ambulatory Visit: Payer: Self-pay | Admitting: Obstetrics and Gynecology

## 2017-11-22 DIAGNOSIS — N6489 Other specified disorders of breast: Secondary | ICD-10-CM

## 2017-11-22 DIAGNOSIS — N6320 Unspecified lump in the left breast, unspecified quadrant: Secondary | ICD-10-CM | POA: Diagnosis not present

## 2017-11-22 DIAGNOSIS — R922 Inconclusive mammogram: Secondary | ICD-10-CM | POA: Diagnosis not present

## 2017-11-28 MED FILL — VENLAFAXINE HCL ER 150 MG C: 150 | 90 days supply | Qty: 180 | Fill #1

## 2017-11-29 ENCOUNTER — Ambulatory Visit
Admission: RE | Admit: 2017-11-29 | Discharge: 2017-11-29 | Disposition: A | Discharge status: Home / Self Care | Payer: 59 | Source: Ambulatory Visit | Attending: Obstetrics and Gynecology | Admitting: Obstetrics and Gynecology

## 2017-11-29 DIAGNOSIS — N6320 Unspecified lump in the left breast, unspecified quadrant: Secondary | ICD-10-CM

## 2017-11-29 DIAGNOSIS — D225 Melanocytic nevi of trunk: Secondary | ICD-10-CM | POA: Diagnosis not present

## 2017-11-29 DIAGNOSIS — D2372 Other benign neoplasm of skin of left lower limb, including hip: Secondary | ICD-10-CM | POA: Diagnosis not present

## 2017-11-29 DIAGNOSIS — L814 Other melanin hyperpigmentation: Secondary | ICD-10-CM | POA: Diagnosis not present

## 2017-11-29 DIAGNOSIS — L570 Actinic keratosis: Secondary | ICD-10-CM | POA: Diagnosis not present

## 2017-11-29 DIAGNOSIS — D242 Benign neoplasm of left breast: Secondary | ICD-10-CM | POA: Diagnosis not present

## 2017-11-29 DIAGNOSIS — N6324 Unspecified lump in the left breast, lower inner quadrant: Secondary | ICD-10-CM | POA: Diagnosis not present

## 2017-11-29 DIAGNOSIS — N6012 Diffuse cystic mastopathy of left breast: Secondary | ICD-10-CM | POA: Diagnosis not present

## 2017-11-29 DIAGNOSIS — N6322 Unspecified lump in the left breast, upper inner quadrant: Secondary | ICD-10-CM | POA: Diagnosis not present

## 2017-11-29 DIAGNOSIS — D485 Neoplasm of uncertain behavior of skin: Secondary | ICD-10-CM | POA: Diagnosis not present

## 2017-11-29 DIAGNOSIS — Z872 Personal history of diseases of the skin and subcutaneous tissue: Secondary | ICD-10-CM | POA: Diagnosis not present

## 2017-11-29 DIAGNOSIS — L309 Dermatitis, unspecified: Secondary | ICD-10-CM | POA: Diagnosis not present

## 2017-12-17 MED FILL — TRIAMCINOLONE ACETONIDE 0.1: 0.1 | 30 days supply | Qty: 454 | Fill #0

## 2018-01-21 MED FILL — SYNTHROID 112 MCG TABLET: 112 | 90 days supply | Qty: 90 | Fill #1

## 2018-02-07 DIAGNOSIS — H40241 Residual stage of angle-closure glaucoma, right eye: Secondary | ICD-10-CM | POA: Diagnosis not present

## 2018-02-18 MED FILL — VENLAFAXINE HCL ER 150 MG C: 150 | 90 days supply | Qty: 180 | Fill #2

## 2018-04-15 MED FILL — SYNTHROID 112 MCG TABLET: 112 | 90 days supply | Qty: 90 | Fill #2

## 2018-05-15 MED FILL — HYDROCHLOROTHIAZIDE 12.5 MG: 12.5 | 90 days supply | Qty: 90 | Fill #1

## 2018-05-27 MED FILL — VENLAFAXINE HCL ER 150 MG C: 150 | 90 days supply | Qty: 180 | Fill #3

## 2018-07-15 MED FILL — SYNTHROID 112 MCG TABLET: 112 | 90 days supply | Qty: 90 | Fill #3

## 2018-07-30 IMAGING — US US BREAST BX W LOC DEV 1ST LESION IMG BX SPEC US GUIDE*L*
1 series · 12 of 12 positions shown · non-contrast
Comparison: Previous exam(s).

ADDENDUM:
Pathology revealed FIBROADENOMA of Left breast, [DATE] 3 cmfn .
Pathology revealed FIBROCYSTIC CHANGES, of Left breast, [DATE] 3 cmfn.
This was found to be concordant by Dr. Salbud Radioo. Pathology
results were discussed with the patient by telephone. The patient
reported doing well after the biopsy with tenderness and bruising at
the site. Post biopsy instructions and care were reviewed and
questions were answered. The patient was encouraged to call The
patient was instructed to return for annual screening mammography at
[HOSPITAL] [facility], [HOSPITAL][HOSPITAL].

Pathology results reported by Armais Solange, RN on 12/02/2017.
CLINICAL DATA: Ultrasound-guided biopsy was recommended of a mass
in the 7 o'clock position of the left breast 3 cm from the nipple.
EXAM:
ULTRASOUND GUIDED LEFT BREAST CORE NEEDLE BIOPSY

[Series 1: us breast bx w loc dev 1st lesion img bx spec us g · 0.05mm/px · 12 of 12 slices shown]
[im 1/12]
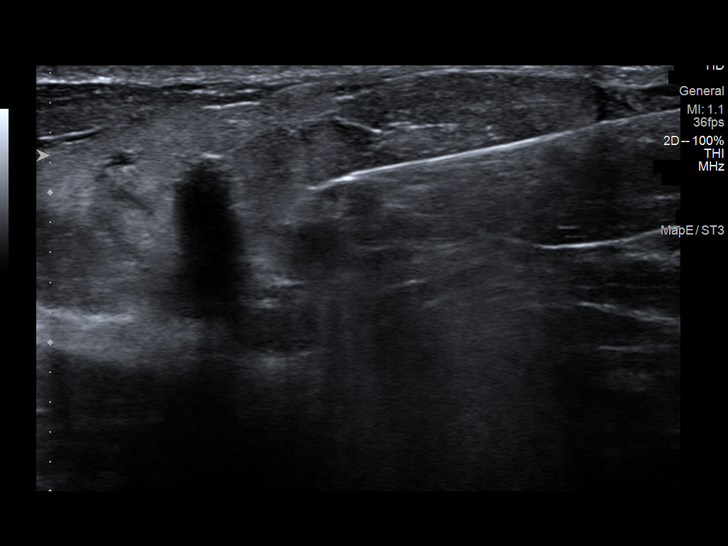
[im 2/12]
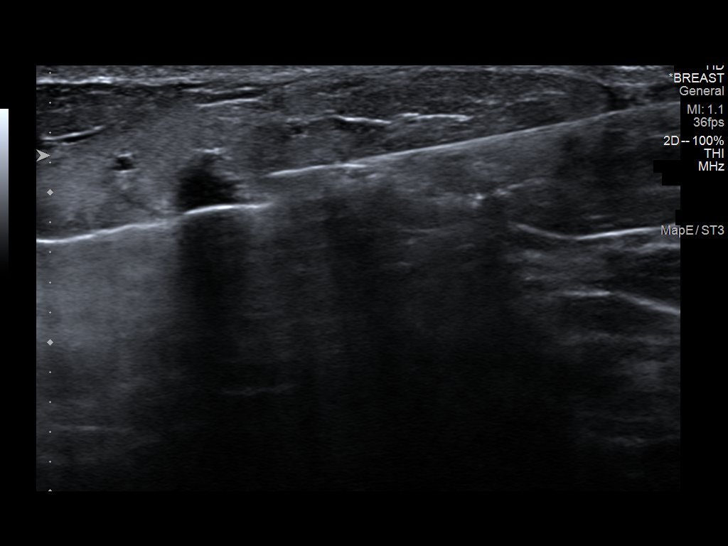
[im 3/12]
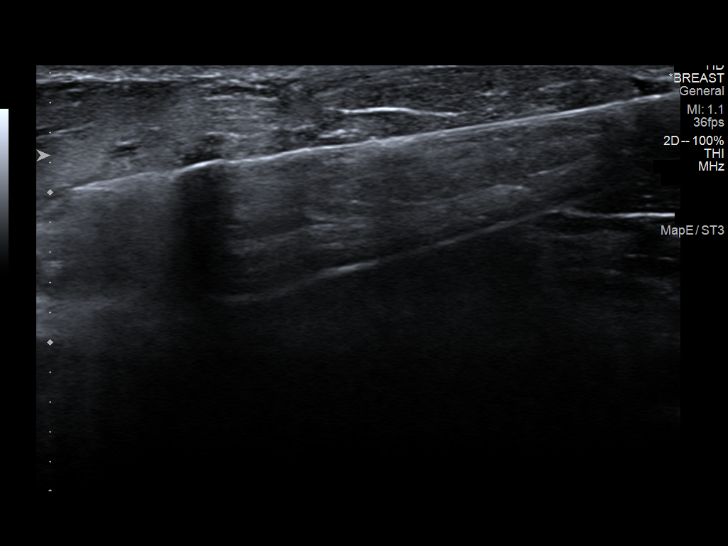
[im 4/12]
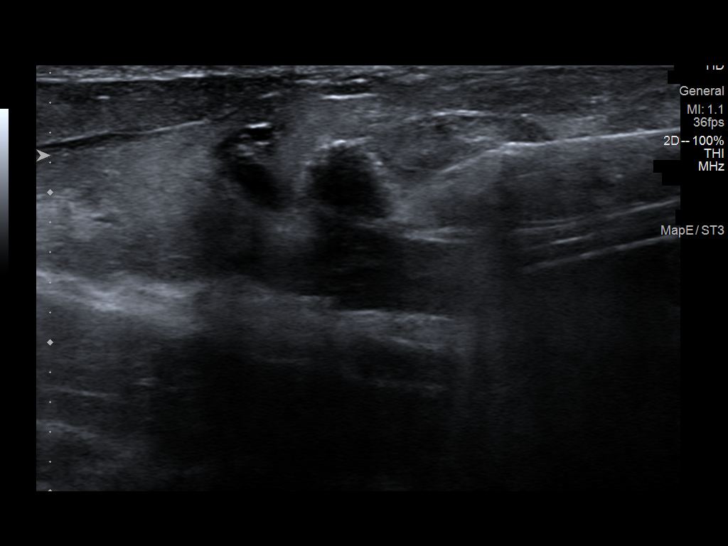
[im 5/12]
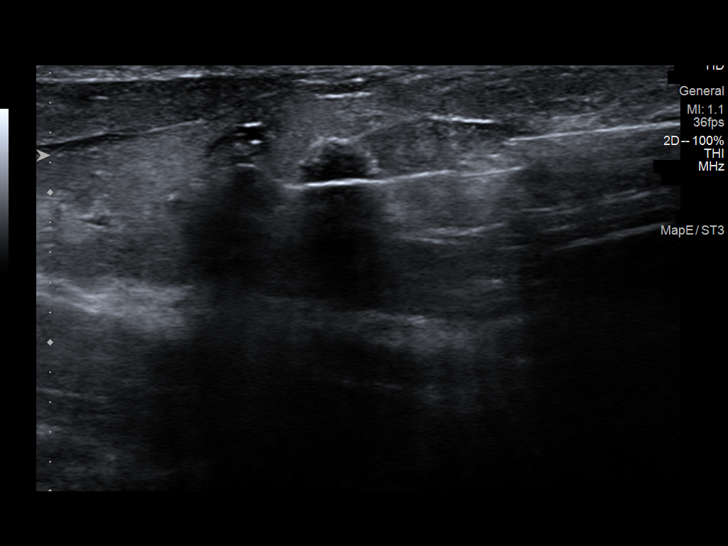
[im 6/12]
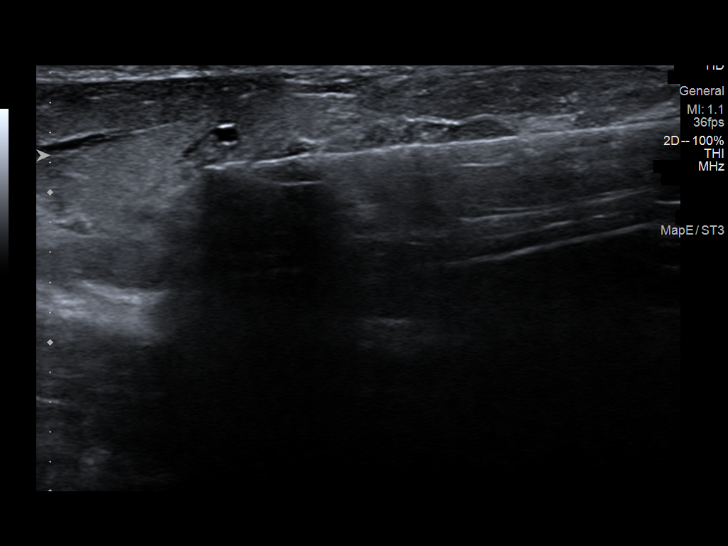
[im 7/12]
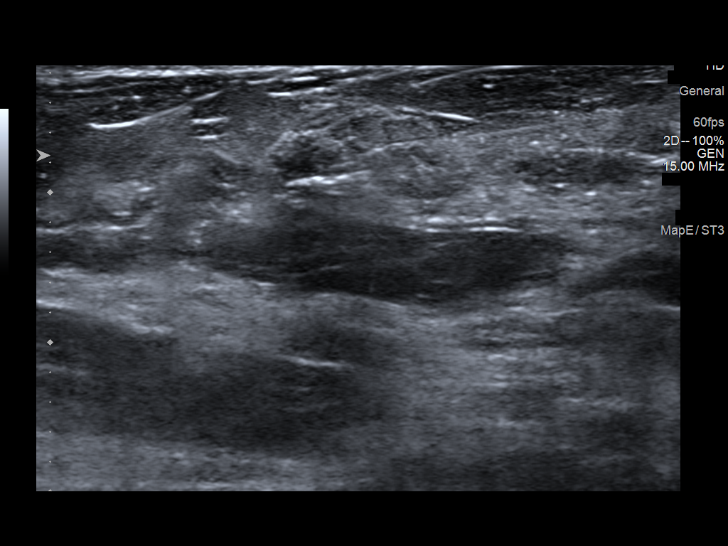
[im 8/12]
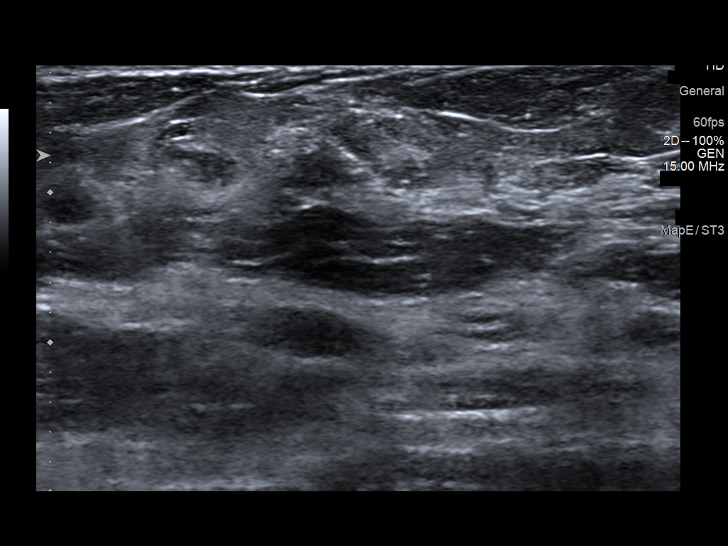
[im 9/12]
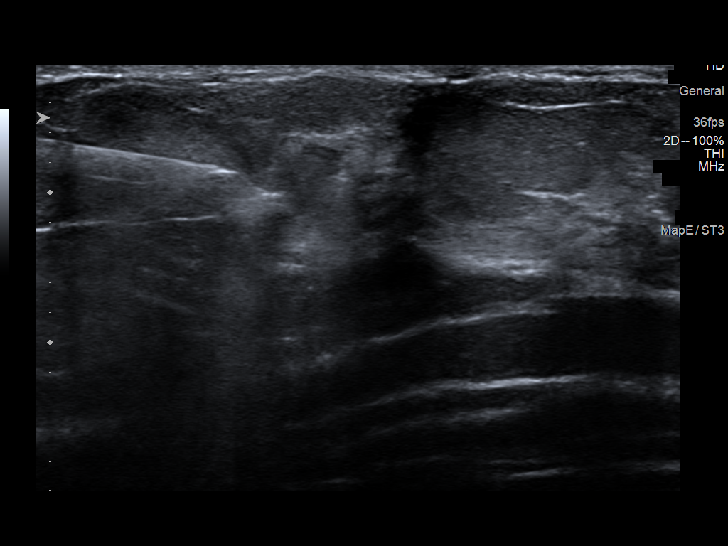
[im 10/12]
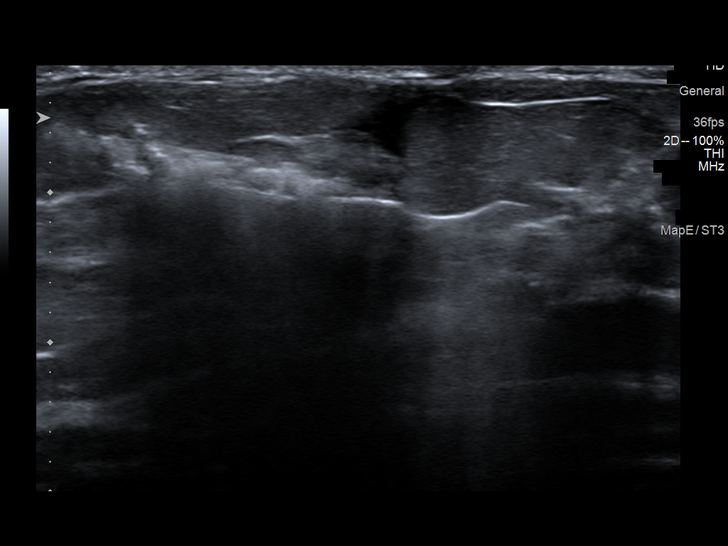
[im 11/12]
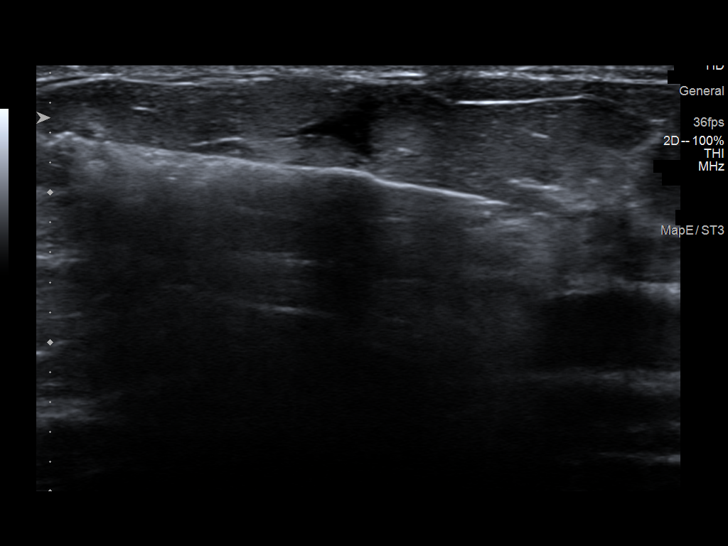
[im 12/12]
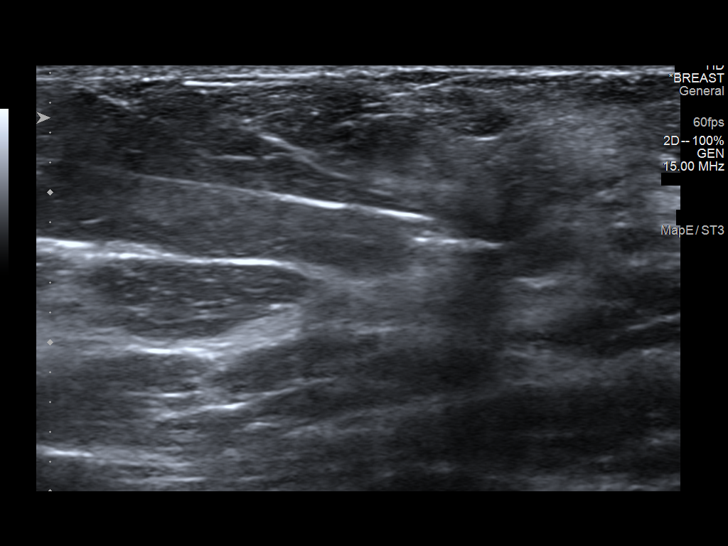

[12 of 12 positions shown; findings below may reference images not displayed]



Lesion quadrant: Lower inner quadrant

Using sterile technique and 1% Lidocaine as local anesthetic, under
direct ultrasound visualization, a 12 gauge Gatica device was
used to perform biopsy of a mass in the 7 o'clock position of the
left breast 3 cm from the nipple using a medial approach. After 1
pass with the biopsy needle, the mass was not well visualized, and
therefore no additional passes were taken. At the conclusion of the
procedure a coil shaped tissue marker clip was deployed into the
biopsy cavity. Follow up 2 view mammogram was performed and dictated
separately.
IMPRESSION: Ultrasound guided biopsy of the left breast at 7 o'clock position.
No apparent complications.

## 2018-08-08 DIAGNOSIS — H40241 Residual stage of angle-closure glaucoma, right eye: Secondary | ICD-10-CM | POA: Diagnosis not present

## 2018-08-08 DIAGNOSIS — H26491 Other secondary cataract, right eye: Secondary | ICD-10-CM | POA: Diagnosis not present

## 2018-08-08 DIAGNOSIS — Z961 Presence of intraocular lens: Secondary | ICD-10-CM | POA: Diagnosis not present

## 2018-08-15 DIAGNOSIS — D225 Melanocytic nevi of trunk: Secondary | ICD-10-CM | POA: Diagnosis not present

## 2018-08-15 DIAGNOSIS — L814 Other melanin hyperpigmentation: Secondary | ICD-10-CM | POA: Diagnosis not present

## 2018-08-15 DIAGNOSIS — Z872 Personal history of diseases of the skin and subcutaneous tissue: Secondary | ICD-10-CM | POA: Diagnosis not present

## 2018-08-15 DIAGNOSIS — D1801 Hemangioma of skin and subcutaneous tissue: Secondary | ICD-10-CM | POA: Diagnosis not present

## 2018-08-15 DIAGNOSIS — L821 Other seborrheic keratosis: Secondary | ICD-10-CM | POA: Diagnosis not present

## 2018-08-15 DIAGNOSIS — L570 Actinic keratosis: Secondary | ICD-10-CM | POA: Diagnosis not present

## 2018-08-22 MED FILL — VENLAFAXINE HCL ER 150 MG C: 150 | 90 days supply | Qty: 180 | Fill #4

## 2018-08-29 ENCOUNTER — Encounter: Payer: Self-pay | Admitting: Family Medicine

## 2018-10-02 ENCOUNTER — Telehealth: Payer: Self-pay | Admitting: Family Medicine

## 2018-10-02 DIAGNOSIS — H26491 Other secondary cataract, right eye: Secondary | ICD-10-CM | POA: Diagnosis not present

## 2018-10-02 NOTE — Telephone Encounter (Signed)
Patient returning a call to [facility]. Please advise.

## 2018-10-02 NOTE — Telephone Encounter (Signed)
See note

## 2018-10-02 NOTE — Telephone Encounter (Signed)
Please advise if pt can be seen sooner.   Copied from [place] [id number]. Topic: Appointment Scheduling - Scheduling Inquiry for Clinic >> Oct 02, 2018 12:30 PM Jacqueline Finley, [place] C wrote: Reason for CRM: pt received a call to reschedule her CPE with Juleen China. Pt says that she has to have her CPE on a Friday and before 10/31/18. Pt is also requesting an early morning apt. Pt is adamant  with her scheduling stipulations, stating that she had to wait for this apt and now it has to be reschedule. Please assist pt with scheduling.

## 2018-10-02 NOTE — Telephone Encounter (Signed)
Called patient l/m to call office so that we can work on moving app.

## 2018-10-02 NOTE — Telephone Encounter (Signed)
Pt called back I spoke to her have some questions about where we can put her in she is also having problems with BP

## 2018-10-03 ENCOUNTER — Encounter: Payer: Self-pay | Admitting: Family Medicine

## 2018-10-03 NOTE — Telephone Encounter (Signed)
Was she in a 7 or 7:20 slot? I'm confused. 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). I believe that she has issues because of work hours. I'm okay with coming in early for her one day. Okay to get in sooner as well. Just remind me which day if I need to come in early.

## 2018-10-06 NOTE — Telephone Encounter (Signed)
Left message to return call to our office. Can you try to call her ok to work in at early app ok per Dr. Juleen China

## 2018-10-08 NOTE — Telephone Encounter (Signed)
Patient is scheduled for 10/24/18 @ 7:20am for CPE

## 2018-10-09 NOTE — Telephone Encounter (Signed)
FYI

## 2018-10-16 ENCOUNTER — Other Ambulatory Visit: Payer: Self-pay | Admitting: Family Medicine

## 2018-10-16 MED FILL — SYNTHROID 112 MCG TABLET: 112 | 90 days supply | Qty: 90 | Fill #0

## 2018-10-20 ENCOUNTER — Encounter: Payer: 59 | Admitting: Family Medicine

## 2018-10-23 NOTE — Progress Notes (Signed)
Subjective:    Jacqueline Finley is a 53 y.o. female and is here for a comprehensive physical exam.  Health Maintenance Due  Topic Date Due  . Hepatitis C Screening  21-Oct-1965  . HIV Screening  02/15/1979  . COLONOSCOPY  02/14/2014  . PAP SMEAR-Modifier  07/25/2018    Current Outpatient Medications:  .  fluticasone (FLONASE) 50 MCG/ACT nasal spray, Place into the nose., Disp: , Rfl:  .  hydrochlorothiazide (MICROZIDE) 12.5 MG capsule, Take 1 capsule (12.5 mg total) by mouth daily., Disp: 90 capsule, Rfl: 3 .  ibuprofen (ADVIL,MOTRIN) 200 MG tablet, Take by mouth., Disp: , Rfl:  .  SYNTHROID 112 MCG tablet, TAKE 1 TABLET BY MOUTH EVERY MORNING ON AN EMPTY STOMACH WITH A GLASS OF WATER AT LEAST 30 TO 60 MINUTES BEFORE BREAKFAST, Disp: 90 tablet, Rfl: 0 .  venlafaxine XR (EFFEXOR-XR) 150 MG 24 hr capsule, Take 1 capsule (150 mg total) by mouth 2 (two) times daily., Disp: 180 capsule, Rfl: 3 .  ondansetron (ZOFRAN-ODT) 4 MG disintegrating tablet, , Disp: , Rfl:   PMHx, SurgHx, SocialHx, Medications, and Allergies were reviewed in the Visit Navigator and updated as appropriate.   Past Medical History:  Diagnosis Date  . Acute angle-closure glaucoma 2015  . Angle-closure glaucoma   . Depression   . Hypothyroidism      Past Surgical History:  Procedure Laterality Date  . CESAREAN SECTION    . INCISION AND DRAINAGE     due to C-section  . ORIF FINGER FRACTURE Right    4th digit     Family History  Problem Relation Age of Onset  . Heart attack Father   . Hypertension Father     Social History   Tobacco Use  . Smoking status: Never Smoker  . Smokeless tobacco: Never Used  Substance Use Topics  . Alcohol use: Yes    Comment: occ  . Drug use: No    Review of Systems:   Pertinent items are noted in the HPI. Otherwise, ROS is negative.  Objective:   BP 124/82   Pulse 77   Temp 98.5 F (36.9 C) (Oral)   Ht 5\' 5"  (1.651 m)   Wt 155 lb 9.6 oz (70.6 kg)   SpO2  99%   BMI 25.89 kg/m   General appearance: alert, cooperative and appears stated age. Head: normocephalic, without obvious abnormality, atraumatic. Neck: no adenopathy, supple, symmetrical, trachea midline; thyroid not enlarged, symmetric, no tenderness/mass/nodules. Lungs: clear to auscultation bilaterally. Heart: regular rate and rhythm Abdomen: soft, non-tender; no masses,  no organomegaly. Extremities: extremities normal, atraumatic, no cyanosis or edema. Skin: skin color, texture, turgor normal, no rashes or lesions. Lymph: cervical, supraclavicular, and axillary nodes normal; no abnormal inguinal nodes palpated. Neurologic: grossly normal.                    Assessment/Plan:   Jacqueline Finley was seen today for annual exam.  Diagnoses and all orders for this visit:  Routine physical examination  Acquired hypothyroidism -     TSH  Primary hyperparathyroidism ([place]) -     PTH, Intact and Calcium  B12 deficiency -     CBC with Differential/Platelet  Vitamin D deficiency  Screening for lipid disorders -     Lipid panel  Hyperglycemia -     Comprehensive metabolic panel -     Hemoglobin A1c  Screening for HIV (human immunodeficiency virus) -     HIV Antibody (routine testing w rflx)  Encounter for hepatitis C virus screening test for high risk patient -     Hepatitis C antibody  Screening for malignant neoplasm of colon -     Ambulatory referral to Gastroenterology   Patient Counseling: [x]    Nutrition: Stressed importance of moderation in sodium/caffeine intake, saturated fat and cholesterol, caloric balance, sufficient intake of fresh fruits, vegetables, fiber, calcium, iron, and 1 mg of folate supplement per day (for females capable of pregnancy).  [x]    Stressed the importance of regular exercise.   [x]    Substance Abuse: Discussed cessation/primary prevention of tobacco, alcohol, or other drug use; driving or other dangerous activities under the influence;  availability of treatment for abuse.   [x]    Injury prevention: Discussed safety belts, safety helmets, smoke detector, smoking near bedding or upholstery.   [x]    Sexuality: Discussed sexually transmitted diseases, partner selection, use of condoms, avoidance of unintended pregnancy  and contraceptive alternatives.  [x]    Dental health: Discussed importance of regular tooth brushing, flossing, and dental visits.  [x]    Health maintenance and immunizations reviewed. Please refer to Health maintenance section.   Briscoe Deutscher, DO [place]

## 2018-10-23 NOTE — Patient Instructions (Signed)
Preventive Care 40-64 Years, Female Preventive care refers to lifestyle choices and visits with your health care provider that can promote health and wellness. What does preventive care include?   A yearly physical exam. This is also called an annual well check.  Dental exams once or twice a year.  Routine eye exams. Ask your health care provider how often you should have your eyes checked.  Personal lifestyle choices, including: ? Daily care of your teeth and gums. ? Regular physical activity. ? Eating a healthy diet. ? Avoiding tobacco and drug use. ? Limiting alcohol use. ? Practicing safe sex. ? Taking low-dose aspirin daily starting at age 50. ? Taking vitamin and mineral supplements as recommended by your health care provider. What happens during an annual well check? The services and screenings done by your health care provider during your annual well check will depend on your age, overall health, lifestyle risk factors, and family history of disease. Counseling Your health care provider may ask you questions about your:  Alcohol use.  Tobacco use.  Drug use.  Emotional well-being.  Home and relationship well-being.  Sexual activity.  Eating habits.  Work and work environment.  Method of birth control.  Menstrual cycle.  Pregnancy history. Screening You may have the following tests or measurements:  Height, weight, and BMI.  Blood pressure.  Lipid and cholesterol levels. These may be checked every 5 years, or more frequently if you are over 50 years old.  Skin check.  Lung cancer screening. You may have this screening every year starting at age 55 if you have a 30-pack-year history of smoking and currently smoke or have quit within the past 15 years.  Colorectal cancer screening. All adults should have this screening starting at age 50 and continuing until age 75. Your health care provider may recommend screening at age 45. You will have tests every  1-10 years, depending on your results and the type of screening test. People at increased risk should start screening at an earlier age. Screening tests may include: ? Guaiac-based fecal occult blood testing. ? Fecal immunochemical test (FIT). ? Stool DNA test. ? Virtual colonoscopy. ? Sigmoidoscopy. During this test, a flexible tube with a tiny camera (sigmoidoscope) is used to examine your rectum and lower colon. The sigmoidoscope is inserted through your anus into your rectum and lower colon. ? Colonoscopy. During this test, a long, thin, flexible tube with a tiny camera (colonoscope) is used to examine your entire colon and rectum.  Hepatitis C blood test.  Hepatitis B blood test.  Sexually transmitted disease (STD) testing.  Diabetes screening. This is done by checking your blood sugar (glucose) after you have not eaten for a while (fasting). You may have this done every 1-3 years.  Mammogram. This may be done every 1-2 years. Talk to your health care provider about when you should start having regular mammograms. This may depend on whether you have a family history of breast cancer.  BRCA-related cancer screening. This may be done if you have a family history of breast, ovarian, tubal, or peritoneal cancers.  Pelvic exam and Pap test. This may be done every 3 years starting at age 21. Starting at age 30, this may be done every 5 years if you have a Pap test in combination with an HPV test.  Bone density scan. This is done to screen for osteoporosis. You may have this scan if you are at high risk for osteoporosis. Discuss your test results, treatment options,   and if necessary, the need for more tests with your health care provider. Vaccines Your health care provider may recommend certain vaccines, such as:  Influenza vaccine. This is recommended every year.  Tetanus, diphtheria, and acellular pertussis (Tdap, Td) vaccine. You may need a Td booster every 10 years.  Varicella  vaccine. You may need this if you have not been vaccinated.  Zoster vaccine. You may need this after age 38.  Measles, mumps, and rubella (MMR) vaccine. You may need at least one dose of MMR if you were born in 1957 or later. You may also need a second dose.  Pneumococcal 13-valent conjugate (PCV13) vaccine. You may need this if you have certain conditions and were not previously vaccinated.  Pneumococcal polysaccharide (PPSV23) vaccine. You may need one or two doses if you smoke cigarettes or if you have certain conditions.  Meningococcal vaccine. You may need this if you have certain conditions.  Hepatitis A vaccine. You may need this if you have certain conditions or if you travel or work in places where you may be exposed to hepatitis A.  Hepatitis B vaccine. You may need this if you have certain conditions or if you travel or work in places where you may be exposed to hepatitis B.  Haemophilus influenzae type b (Hib) vaccine. You may need this if you have certain conditions. Talk to your health care provider about which screenings and vaccines you need and how often you need them. This information is not intended to replace advice given to you by your health care provider. Make sure you discuss any questions you have with your health care provider. Document Released: 10/07/2015 Document Revised: 10/31/2017 Document Reviewed: 07/12/2015 Elsevier Interactive Patient Education  2019 [employer].

## 2018-10-24 ENCOUNTER — Encounter: Payer: Self-pay | Admitting: Family Medicine

## 2018-10-24 ENCOUNTER — Ambulatory Visit (INDEPENDENT_AMBULATORY_CARE_PROVIDER_SITE_OTHER): Payer: 59 | Admitting: Family Medicine

## 2018-10-24 VITALS — BP 124/82 | HR 77 | Temp 98.5°F | Ht 65.0 in | Wt 155.6 lb

## 2018-10-24 DIAGNOSIS — Z13220 Encounter for screening for lipoid disorders: Secondary | ICD-10-CM

## 2018-10-24 DIAGNOSIS — Z01419 Encounter for gynecological examination (general) (routine) without abnormal findings: Secondary | ICD-10-CM | POA: Diagnosis not present

## 2018-10-24 DIAGNOSIS — R739 Hyperglycemia, unspecified: Secondary | ICD-10-CM

## 2018-10-24 DIAGNOSIS — Z6826 Body mass index (BMI) 26.0-26.9, adult: Secondary | ICD-10-CM | POA: Diagnosis not present

## 2018-10-24 DIAGNOSIS — E559 Vitamin D deficiency, unspecified: Secondary | ICD-10-CM | POA: Diagnosis not present

## 2018-10-24 DIAGNOSIS — Z124 Encounter for screening for malignant neoplasm of cervix: Secondary | ICD-10-CM | POA: Diagnosis not present

## 2018-10-24 DIAGNOSIS — E538 Deficiency of other specified B group vitamins: Secondary | ICD-10-CM | POA: Diagnosis not present

## 2018-10-24 DIAGNOSIS — Z0000 Encounter for general adult medical examination without abnormal findings: Secondary | ICD-10-CM | POA: Diagnosis not present

## 2018-10-24 DIAGNOSIS — Z1159 Encounter for screening for other viral diseases: Secondary | ICD-10-CM | POA: Diagnosis not present

## 2018-10-24 DIAGNOSIS — Z1389 Encounter for screening for other disorder: Secondary | ICD-10-CM | POA: Diagnosis not present

## 2018-10-24 DIAGNOSIS — Z9189 Other specified personal risk factors, not elsewhere classified: Secondary | ICD-10-CM

## 2018-10-24 DIAGNOSIS — Z1211 Encounter for screening for malignant neoplasm of colon: Secondary | ICD-10-CM

## 2018-10-24 DIAGNOSIS — E210 Primary hyperparathyroidism: Secondary | ICD-10-CM

## 2018-10-24 DIAGNOSIS — E039 Hypothyroidism, unspecified: Secondary | ICD-10-CM

## 2018-10-24 DIAGNOSIS — Z130 Encounter for screening for diseases of the blood and blood-forming organs and certain disorders involving the immune mechanism: Secondary | ICD-10-CM | POA: Diagnosis not present

## 2018-10-24 DIAGNOSIS — Z114 Encounter for screening for human immunodeficiency virus [HIV]: Secondary | ICD-10-CM | POA: Diagnosis not present

## 2018-10-24 LAB — CBC WITH DIFFERENTIAL/PLATELET
Basophils Absolute: 0.1 10*3/uL (ref 0.0–0.1)
Basophils Relative: 1 % (ref 0.0–3.0)
Eosinophils Absolute: 0.1 10*3/uL (ref 0.0–0.7)
Eosinophils Relative: 2.4 % (ref 0.0–5.0)
HCT: 44.9 % (ref 36.0–46.0)
Hemoglobin: 14.9 g/dL (ref 12.0–15.0)
Lymphocytes Relative: 33.7 % (ref 12.0–46.0)
Lymphs Abs: 1.9 10*3/uL (ref 0.7–4.0)
MCHC: 33.2 g/dL (ref 30.0–36.0)
MCV: 86.1 fl (ref 78.0–100.0)
Monocytes Absolute: 0.4 10*3/uL (ref 0.1–1.0)
Monocytes Relative: 7.2 % (ref 3.0–12.0)
Neutro Abs: 3.1 10*3/uL (ref 1.4–7.7)
Neutrophils Relative %: 55.7 % (ref 43.0–77.0)
Platelets: 363 10*3/uL (ref 150.0–400.0)
RBC: 5.21 Mil/uL — ABNORMAL HIGH (ref 3.87–5.11)
RDW: 13.7 % (ref 11.5–15.5)
WBC: 5.5 10*3/uL (ref 4.0–10.5)

## 2018-10-24 LAB — LIPID PANEL
Cholesterol: 210 mg/dL — ABNORMAL HIGH (ref 0–200)
HDL: 51.1 mg/dL (ref >39.00)
LDL Cholesterol: 139 mg/dL — ABNORMAL HIGH (ref 0–99)
NonHDL: 158.82
Total CHOL/HDL Ratio: 4
Triglycerides: 101 mg/dL (ref 0.0–149.0)
VLDL: 20.2 mg/dL (ref 0.0–40.0)

## 2018-10-24 LAB — COMPREHENSIVE METABOLIC PANEL
ALT: 17 U/L (ref 0–35)
AST: 15 U/L (ref 0–37)
Albumin: 4.6 g/dL (ref 3.5–5.2)
Alkaline Phosphatase: 44 U/L (ref 39–117)
BUN: 18 mg/dL (ref 6–23)
CO2: 22 mEq/L (ref 19–32)
Calcium: 9.3 mg/dL (ref 8.4–10.5)
Chloride: 104 mEq/L (ref 96–112)
Creatinine, Ser: 0.58 mg/dL (ref 0.40–1.20)
GFR: 108.06 mL/min (ref >60.00)
Glucose, Bld: 88 mg/dL (ref 70–99)
Potassium: 4.1 mEq/L (ref 3.5–5.1)
Sodium: 135 mEq/L (ref 135–145)
Total Bilirubin: 0.3 mg/dL (ref 0.2–1.2)
Total Protein: 7.3 g/dL (ref 6.0–8.3)

## 2018-10-24 LAB — TSH: TSH: 3.29 u[IU]/mL (ref 0.35–4.50)

## 2018-10-24 LAB — RESULTS CONSOLE HPV: CHL HPV: NEGATIVE

## 2018-10-24 LAB — HEMOGLOBIN A1C: Hgb A1c MFr Bld: 5.6 % (ref 4.6–6.5)

## 2018-10-24 LAB — HM PAP SMEAR

## 2018-10-24 NOTE — Telephone Encounter (Signed)
Copied from [place] [id number]. Topic: Quick Communication - See Telephone Encounter >> Oct 24, 2018 10:15 AM Loma Boston wrote: CRM for notification. See Telephone encounter for: 10/24/18. PT waw Dr Juleen China and she was going to give her a couple names of counselors that she could talk to... got distracted about other things.. forgot to get the names from her. Would someone just send her a MyChart message with that info?

## 2018-10-26 LAB — PTH, INTACT AND CALCIUM
Calcium: 9.5 mg/dL (ref 8.6–10.4)
PTH: 20 pg/mL (ref 14–64)

## 2018-10-26 LAB — HEPATITIS C ANTIBODY
Hepatitis C Ab: NONREACTIVE
SIGNAL TO CUT-OFF: 0.02 (ref <1.00)

## 2018-10-26 LAB — HIV ANTIBODY (ROUTINE TESTING W REFLEX): HIV 1&2 Ab, 4th Generation: NONREACTIVE

## 2018-10-27 ENCOUNTER — Encounter: Payer: Self-pay | Admitting: Family Medicine

## 2018-10-28 NOTE — Telephone Encounter (Signed)
Ok to order 

## 2018-10-29 ENCOUNTER — Other Ambulatory Visit: Payer: Self-pay

## 2018-10-29 DIAGNOSIS — E559 Vitamin D deficiency, unspecified: Secondary | ICD-10-CM

## 2018-10-31 ENCOUNTER — Encounter: Payer: 59 | Admitting: Family Medicine

## 2018-11-27 ENCOUNTER — Other Ambulatory Visit: Payer: Self-pay

## 2018-11-27 ENCOUNTER — Encounter: Payer: Self-pay | Admitting: Family Medicine

## 2018-11-27 MED ORDER — VENLAFAXINE HCL ER 150 MG PO CP24: 150.0000 mg | ORAL_CAPSULE | Freq: Two times a day (BID) | ORAL | 3 refills | Status: DC

## 2018-11-27 MED ORDER — SYNTHROID 112 MCG PO TABS: ORAL_TABLET | ORAL | 3 refills | Status: DC

## 2018-11-27 MED FILL — VENLAFAXINE HCL ER 150 MG C: 150 | 90 days supply | Qty: 180 | Fill #0

## 2018-12-02 ENCOUNTER — Encounter: Payer: Self-pay | Admitting: Family Medicine

## 2018-12-22 ENCOUNTER — Other Ambulatory Visit: Payer: Self-pay | Admitting: Family Medicine

## 2018-12-22 MED FILL — SYNTHROID 112 MCG TABLET: 112 | 90 days supply | Qty: 90 | Fill #0

## 2018-12-22 MED FILL — HYDROCHLOROTHIAZIDE 12.5 MG: 12.5 | 90 days supply | Qty: 90 | Fill #0 | Status: TO

## 2019-02-12 ENCOUNTER — Encounter: Payer: Self-pay | Admitting: Family Medicine

## 2019-02-13 ENCOUNTER — Telehealth: Payer: Self-pay

## 2019-02-13 NOTE — Telephone Encounter (Signed)
Called pt and left VM to call the office.  

## 2019-02-13 NOTE — Telephone Encounter (Signed)
Per MyChart message - Dr. Juleen China,   I am going through one of the toughest times of my life right now. I was furloughed/let go due to the pandemic and who knows ( l don't) for reasons unknown. I am on Effexor. It has been good for a long time and I do not want to stop it. Is there anything we could add to it that would not have any sexual side effects or anything that would make me tired to it hopefully for just a short time. Thank you   Jacqueline Finley

## 2019-02-18 NOTE — Telephone Encounter (Signed)
Called pt and left VM to call the office.  

## 2019-02-23 MED FILL — VENLAFAXINE HCL ER 150 MG C: 150 | 30 days supply | Qty: 60 | Fill #1 | Status: TO

## 2019-06-24 ENCOUNTER — Encounter: Payer: Self-pay | Admitting: Family Medicine

## 2019-06-26 NOTE — Telephone Encounter (Signed)
Last fill 11/27/18 for both medications #90/3 Last OV 10/24/18 Pt would like refills sent to [facility] [phone], [place]

## 2019-06-28 MED ORDER — SYNTHROID 112 MCG PO TABS: ORAL_TABLET | ORAL | 2 refills | Status: DC

## 2019-06-28 MED ORDER — VENLAFAXINE HCL ER 150 MG PO CP24: 150.0000 mg | ORAL_CAPSULE | Freq: Two times a day (BID) | ORAL | 3 refills | Status: DC

## 2019-09-14 ENCOUNTER — Telehealth: Payer: Self-pay | Admitting: Family Medicine

## 2019-09-14 NOTE — Telephone Encounter (Signed)
Left vm message for patient regarding request for rx refill/labs.  Requested a c/b from patient.

## 2019-09-14 NOTE — Telephone Encounter (Signed)
She is not due for annual labs until 10/24/2018. Can I just send in her refills now and then do labs after 1/31?  Orma Flaming, MD [place]

## 2019-09-14 NOTE — Telephone Encounter (Signed)
Pt called asking for med refill. She has a physical in February but needs medicine before then. Asked if she could have labs done before physical so she could get her normal supply of medicine. Please advise.

## 2019-09-15 NOTE — Telephone Encounter (Signed)
Spoke with patient.  She states that she tried to call back to speak to me yesterday 12/21 regarding medication refill.  She states that she actually tried to call back twice; was put on hold each time-1 of which was over a 20 minute hold.  Patient is extremely frustrated and states that she gave up on our office and is transferring to [facility].  She asked that I cancel her upcoming appointment for 2/5.  I apologized for the inconvenience and asked if there was anything at all that I could do to help and she reiterated that she had just given up on our office due to the difficulty that she had getting through to anyone.

## 2019-09-16 NOTE — Telephone Encounter (Signed)
Just fyi.

## 2019-10-02 NOTE — Telephone Encounter (Signed)
I called and lvm to discuss and inform patient of our new process of taking calls back in our office.

## 2019-10-30 ENCOUNTER — Encounter: Payer: 59 | Admitting: Family Medicine

## 2020-04-06 ENCOUNTER — Other Ambulatory Visit: Payer: Self-pay | Admitting: Family Medicine

## 2021-02-17 LAB — HM MAMMOGRAPHY

## 2021-06-12 DIAGNOSIS — S62619A Displaced fracture of proximal phalanx of unspecified finger, initial encounter for closed fracture: Secondary | ICD-10-CM | POA: Insufficient documentation

## 2021-06-30 ENCOUNTER — Ambulatory Visit (INDEPENDENT_AMBULATORY_CARE_PROVIDER_SITE_OTHER): Payer: 59 | Admitting: Physician Assistant

## 2021-06-30 ENCOUNTER — Other Ambulatory Visit: Payer: Self-pay

## 2021-06-30 ENCOUNTER — Encounter: Payer: Self-pay | Admitting: Physician Assistant

## 2021-06-30 VITALS — BP 150/90 | HR 79 | Temp 97.8°F | Ht 64.5 in | Wt 163.2 lb

## 2021-06-30 DIAGNOSIS — Z0000 Encounter for general adult medical examination without abnormal findings: Secondary | ICD-10-CM

## 2021-06-30 DIAGNOSIS — E039 Hypothyroidism, unspecified: Secondary | ICD-10-CM | POA: Diagnosis not present

## 2021-06-30 DIAGNOSIS — E559 Vitamin D deficiency, unspecified: Secondary | ICD-10-CM

## 2021-06-30 DIAGNOSIS — I10 Essential (primary) hypertension: Secondary | ICD-10-CM

## 2021-06-30 MED ORDER — HYDROCHLOROTHIAZIDE 25 MG PO TABS: ORAL_TABLET | ORAL | 3 refills | Status: DC

## 2021-06-30 MED ORDER — VENLAFAXINE HCL ER 150 MG PO CP24: 150.0000 mg | ORAL_CAPSULE | Freq: Two times a day (BID) | ORAL | 3 refills | Status: DC

## 2021-06-30 MED ORDER — SYNTHROID 112 MCG PO TABS: ORAL_TABLET | ORAL | 3 refills | Status: DC

## 2021-06-30 NOTE — Patient Instructions (Signed)
It was great to see you! ? ?Please go to the lab for blood work.  ? ?Our office will call you with your results unless you have chosen to receive results via MyChart. ? ?If your blood work is normal we will follow-up each year for physicals and as scheduled for chronic medical problems. ? ?If anything is abnormal we will treat accordingly and get you in for a follow-up. ? ?Take care, ? ?Samyukta Cura ?  ? ? ?

## 2021-06-30 NOTE — Progress Notes (Signed)
I acted as a Education administrator for [employer], PA-C Anselmo Pickler, LPN    Subjective:    Jacqueline Finley is a 55 y.o. female and is here for a comprehensive physical exam.  She was a former [facility] patient but since her previous provider Dr Briscoe Deutscher left, she established at [facility]. She is now here for new patient visit with me.  HPI  Health Maintenance Due  Topic Date Due   COLONOSCOPY (Pts 45-44yrs Insurance coverage will need to be confirmed)  Never done   Zoster Vaccines- Shingrix (1 of 2) Never done   PAP SMEAR-Modifier  07/25/2018   MAMMOGRAM  11/23/2019   INFLUENZA VACCINE  04/24/2021    Acute Concerns: None  Chronic Issues: Vit D deficiency -- would like blood work checked today. She does not have any concerns. Hypothyroidism -- currently taking synthroid 112 mcg daily. Has been very stable with her medications. Denies concerns. Depression -- currently taking effexor 150 mg BID. Has been on this for several years. Denies any concerns, SI/HI. HTN -- Currently taking HCTZ 25 mg. At home blood pressure readings are: well controlled. Patient denies chest pain, SOB, blurred vision, dizziness, unusual headaches, lower leg swelling. Patient is compliant with medication. Denies excessive caffeine intake, stimulant usage, excessive alcohol intake, or increase in salt consumption.  BP Readings from Last 3 Encounters:  06/30/21 (!) 150/90  10/24/18 124/82  10/18/17 130/84    Health Maintenance: Immunizations -- UTD,  will get at work Colonoscopy -- overdue (planning to do cologaurd) Mammogram -- UTD, will get copy PAP -- UTD, will get copy Bone Density -- N/A Diet -- eats healthy Sleep habits -- no concerns Exercise -- regular exercise Weight -- Weight: 163 lb 4 oz (74 kg)  Mood -- stable, denies concerns Weight history: Wt Readings from Last 10 Encounters:  06/30/21 163 lb 4 oz (74 kg)  10/24/18 155 lb 9.6 oz (70.6 kg)  10/18/17 160 lb 3.2 oz (72.7 kg)   04/26/17 156 lb 6.4 oz (70.9 kg)  10/20/14 169 lb 4.8 oz (76.8 kg)  09/28/13 155 lb (70.3 kg)   Body mass index is 27.59 kg/m. No LMP recorded. Patient has had an ablation. Alcohol use:  reports current alcohol use. Tobacco use:  Tobacco Use: Low Risk    Smoking Tobacco Use: Never   Smokeless Tobacco Use: Never     Depression screen [facility] 2/9 06/30/2021  Decreased Interest 0  Down, Depressed, Hopeless 0  PHQ - 2 Score 0  Altered sleeping 0  Tired, decreased energy 0  Change in appetite 0  Feeling bad or failure about yourself  0  Trouble concentrating 0  Moving slowly or fidgety/restless 0  Suicidal thoughts 0  PHQ-9 Score 0  Difficult doing work/chores Not difficult at all     Other providers/specialists: Patient Care Team: Inda Coke, [place] as PCP - General (Physician Assistant)    PMHx, SurgHx, SocialHx, Medications, and Allergies were reviewed in the Visit Navigator and updated as appropriate.   Past Medical History:  Diagnosis Date   Acute angle-closure glaucoma 2015   from scopalamine patch   Depression    Hypothyroidism      Past Surgical History:  Procedure Laterality Date   CESAREAN SECTION     INCISION AND DRAINAGE     due to C-section   ORIF FINGER FRACTURE Right    4th digit     Family History  Problem Relation Age of Onset   Heart attack Father  Hypertension Father     Social History   Tobacco Use   Smoking status: Never   Smokeless tobacco: Never  Vaping Use   Vaping Use: Never used  Substance Use Topics   Alcohol use: Yes    Comment: occ   Drug use: No    Review of Systems:   Review of Systems  Constitutional:  Negative for chills, fever, malaise/fatigue and weight loss.  HENT:  Negative for hearing loss, sinus pain and sore throat.   Respiratory:  Negative for cough and hemoptysis.   Cardiovascular:  Negative for chest pain, palpitations, leg swelling and PND.  Gastrointestinal:  Negative for abdominal pain,  constipation, diarrhea, heartburn, nausea and vomiting.  Genitourinary:  Negative for dysuria, frequency and urgency.  Musculoskeletal:  Negative for back pain, myalgias and neck pain.  Skin:  Negative for itching and rash.  Neurological:  Negative for dizziness, tingling, seizures and headaches.  Endo/Heme/Allergies:  Negative for polydipsia.  Psychiatric/Behavioral:  Negative for depression. The patient is not nervous/anxious.    Objective:   BP (!) 150/90 (BP Location: Right Arm, Patient Position: Sitting, Cuff Size: Normal)   Pulse 79   Temp 97.8 F (36.6 C) (Temporal)   Ht 5' 4.5" (1.638 m)   Wt 163 lb 4 oz (74 kg)   SpO2 99%   BMI 27.59 kg/m  Body mass index is 27.59 kg/m.   General Appearance:    Alert, cooperative, no distress, appears stated age  Head:    Normocephalic, without obvious abnormality, atraumatic  Eyes:    PERRL, conjunctiva/corneas clear, EOM's intact, fundi    benign, both eyes  Ears:    Normal TM's and external ear canals, both ears  Nose:   Nares normal, septum midline, mucosa normal, no drainage    or sinus tenderness  Throat:   Lips, mucosa, and tongue normal; teeth and gums normal  Neck:   Supple, symmetrical, trachea midline, no adenopathy;    thyroid:  no enlargement/tenderness/nodules; no carotid   bruit or JVD  Back:     Symmetric, no curvature, ROM normal, no CVA tenderness  Lungs:     Clear to auscultation bilaterally, respirations unlabored  Chest Wall:    No tenderness or deformity   Heart:    Regular rate and rhythm, S1 and S2 normal, no murmur, rub or gallop  Breast Exam:    Deferred  Abdomen:     Soft, non-tender, bowel sounds active all four quadrants,    no masses, no organomegaly  Genitalia:    Deferred  Extremities:   Extremities normal, atraumatic, no cyanosis or edema  Pulses:   2+ and symmetric all extremities  Skin:   Skin color, texture, turgor normal, no rashes or lesions  Lymph nodes:   Cervical, supraclavicular, and  axillary nodes normal  Neurologic:   CNII-XII intact, normal strength, sensation and reflexes    throughout    Assessment/Plan:   Routine physical examination Today patient counseled on age appropriate routine health concerns for screening and prevention, each reviewed and up to date or declined. Immunizations reviewed and up to date or declined. Labs ordered and reviewed. Risk factors for depression reviewed and negative. Hearing function and visual acuity are intact. ADLs screened and addressed as needed. Functional ability and level of safety reviewed and appropriate. Education, counseling and referrals performed based on assessed risks today. Patient provided with a copy of personalized plan for preventive services.  Acquired hypothyroidism Suspect well controlled Update TSH Refill synthroid 112 mcg  daily  Primary hypertension Normally well controlled Refill HCTZ 25 mg daily Follow-up in 6 mo  Vitamin D deficiency Update Vit D level and provide recommendations accordingly   Patient Counseling: [x]    Nutrition: Stressed importance of moderation in sodium/caffeine intake, saturated fat and cholesterol, caloric balance, sufficient intake of fresh fruits, vegetables, fiber, calcium, iron, and 1 mg of folate supplement per day (for females capable of pregnancy).  [x]    Stressed the importance of regular exercise.   [x]    Substance Abuse: Discussed cessation/primary prevention of tobacco, alcohol, or other drug use; driving or other dangerous activities under the influence; availability of treatment for abuse.   [x]    Injury prevention: Discussed safety belts, safety helmets, smoke detector, smoking near bedding or upholstery.   [x]    Sexuality: Discussed sexually transmitted diseases, partner selection, use of condoms, avoidance of unintended pregnancy  and contraceptive alternatives.  [x]    Dental health: Discussed importance of regular tooth brushing, flossing, and dental visits.  [x]     Health maintenance and immunizations reviewed. Please refer to Health maintenance section.   CMA or LPN served as scribe during this visit. History, Physical, and Plan performed by medical provider. The above documentation has been reviewed and is accurate and complete.  Inda Coke, PA-C [place]

## 2021-07-01 LAB — CBC WITH DIFFERENTIAL/PLATELET
Absolute Monocytes: 552 cells/uL (ref 200–950)
Basophils Absolute: 62 cells/uL (ref 0–200)
Basophils Relative: 0.9 %
Eosinophils Absolute: 242 cells/uL (ref 15–500)
Eosinophils Relative: 3.5 %
HCT: 41.7 % (ref 35.0–45.0)
Hemoglobin: 13.8 g/dL (ref 11.7–15.5)
Lymphs Abs: 2650 cells/uL (ref 850–3900)
MCH: 27.6 pg (ref 27.0–33.0)
MCHC: 33.1 g/dL (ref 32.0–36.0)
MCV: 83.4 fL (ref 80.0–100.0)
MPV: 10 fL (ref 7.5–12.5)
Monocytes Relative: 8 %
Neutro Abs: 3395 cells/uL (ref 1500–7800)
Neutrophils Relative %: 49.2 %
Platelets: 413 10*3/uL — ABNORMAL HIGH (ref 140–400)
RBC: 5 10*6/uL (ref 3.80–5.10)
RDW: 12.7 % (ref 11.0–15.0)
Total Lymphocyte: 38.4 %
WBC: 6.9 10*3/uL (ref 3.8–10.8)

## 2021-07-01 LAB — COMPREHENSIVE METABOLIC PANEL
AG Ratio: 1.5 (calc) (ref 1.0–2.5)
ALT: 43 U/L — ABNORMAL HIGH (ref 6–29)
AST: 28 U/L (ref 10–35)
Albumin: 4.4 g/dL (ref 3.6–5.1)
Alkaline phosphatase (APISO): 66 U/L (ref 37–153)
BUN: 21 mg/dL (ref 7–25)
CO2: 24 mmol/L (ref 20–32)
Calcium: 9.2 mg/dL (ref 8.6–10.4)
Chloride: 99 mmol/L (ref 98–110)
Creat: 0.5 mg/dL (ref 0.50–1.03)
Globulin: 2.9 g/dL (calc) (ref 1.9–3.7)
Glucose, Bld: 86 mg/dL (ref 65–99)
Potassium: 3.8 mmol/L (ref 3.5–5.3)
Sodium: 135 mmol/L (ref 135–146)
Total Bilirubin: 0.2 mg/dL (ref 0.2–1.2)
Total Protein: 7.3 g/dL (ref 6.1–8.1)

## 2021-07-01 LAB — LIPID PANEL
Cholesterol: 212 mg/dL — ABNORMAL HIGH (ref <200)
HDL: 47 mg/dL — ABNORMAL LOW (ref >=50)
LDL Cholesterol (Calc): 139 mg/dL (calc) — ABNORMAL HIGH
Non-HDL Cholesterol (Calc): 165 mg/dL (calc) — ABNORMAL HIGH (ref <130)
Total CHOL/HDL Ratio: 4.5 (calc) (ref <5.0)
Triglycerides: 140 mg/dL (ref <150)

## 2021-07-01 LAB — TSH: TSH: 2.05 mIU/L (ref 0.40–4.50)

## 2021-07-01 LAB — VITAMIN D 25 HYDROXY (VIT D DEFICIENCY, FRACTURES): Vit D, 25-Hydroxy: 44 ng/mL (ref 30–100)

## 2021-07-03 ENCOUNTER — Other Ambulatory Visit: Payer: Self-pay | Admitting: Physician Assistant

## 2021-07-03 DIAGNOSIS — R7989 Other specified abnormal findings of blood chemistry: Secondary | ICD-10-CM

## 2021-07-04 ENCOUNTER — Encounter: Payer: Self-pay | Admitting: Physician Assistant

## 2021-07-04 ENCOUNTER — Other Ambulatory Visit: Payer: Self-pay | Admitting: Physician Assistant

## 2021-07-04 ENCOUNTER — Telehealth: Payer: Self-pay

## 2021-07-04 NOTE — Telephone Encounter (Signed)
LMOVM  Insurance will not pay for brand name SYNTHROID  Will pay for EUTHYROX LEVOTHYROXINE LEVOXYL Jacqueline Finley

## 2021-07-06 ENCOUNTER — Encounter: Payer: Self-pay | Admitting: Physician Assistant

## 2021-07-31 ENCOUNTER — Encounter (HOSPITAL_COMMUNITY)
Admission: AD | Disposition: A | Discharge status: Home / Self Care | Payer: Self-pay | Source: Ambulatory Visit | Attending: Urology

## 2021-07-31 ENCOUNTER — Encounter (HOSPITAL_COMMUNITY): Payer: Self-pay | Admitting: Urology

## 2021-07-31 ENCOUNTER — Other Ambulatory Visit: Payer: Self-pay

## 2021-07-31 ENCOUNTER — Ambulatory Visit (HOSPITAL_COMMUNITY): Payer: 59

## 2021-07-31 ENCOUNTER — Ambulatory Visit (HOSPITAL_COMMUNITY): Payer: 59 | Admitting: Anesthesiology

## 2021-07-31 ENCOUNTER — Other Ambulatory Visit: Payer: Self-pay | Admitting: Urology

## 2021-07-31 ENCOUNTER — Inpatient Hospital Stay (HOSPITAL_COMMUNITY)
Admission: AD | Admit: 2021-07-31 | Discharge: 2021-08-02 | DRG: 854 | Disposition: A | Discharge status: Home / Self Care | Payer: 59 | Source: Ambulatory Visit | Attending: Urology | Admitting: Urology

## 2021-07-31 DIAGNOSIS — N3030 Trigonitis without hematuria: Secondary | ICD-10-CM | POA: Diagnosis present

## 2021-07-31 DIAGNOSIS — Z79899 Other long term (current) drug therapy: Secondary | ICD-10-CM

## 2021-07-31 DIAGNOSIS — N12 Tubulo-interstitial nephritis, not specified as acute or chronic: Secondary | ICD-10-CM | POA: Diagnosis present

## 2021-07-31 DIAGNOSIS — F32A Depression, unspecified: Secondary | ICD-10-CM | POA: Diagnosis present

## 2021-07-31 DIAGNOSIS — E871 Hypo-osmolality and hyponatremia: Secondary | ICD-10-CM | POA: Diagnosis present

## 2021-07-31 DIAGNOSIS — B961 Klebsiella pneumoniae [K. pneumoniae] as the cause of diseases classified elsewhere: Secondary | ICD-10-CM | POA: Diagnosis present

## 2021-07-31 DIAGNOSIS — Z87442 Personal history of urinary calculi: Secondary | ICD-10-CM

## 2021-07-31 DIAGNOSIS — E892 Postprocedural hypoparathyroidism: Secondary | ICD-10-CM | POA: Diagnosis present

## 2021-07-31 DIAGNOSIS — E876 Hypokalemia: Secondary | ICD-10-CM | POA: Diagnosis present

## 2021-07-31 DIAGNOSIS — Z8249 Family history of ischemic heart disease and other diseases of the circulatory system: Secondary | ICD-10-CM

## 2021-07-31 DIAGNOSIS — N3080 Other cystitis without hematuria: Secondary | ICD-10-CM | POA: Diagnosis present

## 2021-07-31 DIAGNOSIS — A414 Sepsis due to anaerobes: Secondary | ICD-10-CM | POA: Diagnosis present

## 2021-07-31 DIAGNOSIS — H40241 Residual stage of angle-closure glaucoma, right eye: Secondary | ICD-10-CM | POA: Diagnosis present

## 2021-07-31 DIAGNOSIS — Z7951 Long term (current) use of inhaled steroids: Secondary | ICD-10-CM

## 2021-07-31 DIAGNOSIS — E039 Hypothyroidism, unspecified: Secondary | ICD-10-CM | POA: Diagnosis present

## 2021-07-31 DIAGNOSIS — Z20822 Contact with and (suspected) exposure to covid-19: Secondary | ICD-10-CM | POA: Diagnosis present

## 2021-07-31 DIAGNOSIS — E8809 Other disorders of plasma-protein metabolism, not elsewhere classified: Secondary | ICD-10-CM | POA: Diagnosis present

## 2021-07-31 DIAGNOSIS — Z885 Allergy status to narcotic agent status: Secondary | ICD-10-CM

## 2021-07-31 DIAGNOSIS — A4159 Other Gram-negative sepsis: Principal | ICD-10-CM | POA: Diagnosis present

## 2021-07-31 DIAGNOSIS — Z79890 Hormone replacement therapy (postmenopausal): Secondary | ICD-10-CM

## 2021-07-31 DIAGNOSIS — H4020X0 Unspecified primary angle-closure glaucoma, stage unspecified: Secondary | ICD-10-CM | POA: Diagnosis present

## 2021-07-31 DIAGNOSIS — N201 Calculus of ureter: Secondary | ICD-10-CM | POA: Diagnosis present

## 2021-07-31 DIAGNOSIS — I10 Essential (primary) hypertension: Secondary | ICD-10-CM | POA: Diagnosis present

## 2021-07-31 DIAGNOSIS — Z888 Allergy status to other drugs, medicaments and biological substances status: Secondary | ICD-10-CM

## 2021-07-31 HISTORY — PX: CYSTOSCOPY WITH RETROGRADE PYELOGRAM, URETEROSCOPY AND STENT PLACEMENT: SHX5789

## 2021-07-31 HISTORY — DX: Nausea with vomiting, unspecified: R11.2

## 2021-07-31 HISTORY — DX: Other specified postprocedural states: Z98.890

## 2021-07-31 LAB — COMPREHENSIVE METABOLIC PANEL
ALT: 20 U/L (ref 0–44)
AST: 29 U/L (ref 15–41)
Albumin: 3.5 g/dL (ref 3.5–5.0)
Alkaline Phosphatase: 69 U/L (ref 38–126)
Anion gap: 15 (ref 5–15)
BUN: 24 mg/dL — ABNORMAL HIGH (ref 6–20)
CO2: 18 mmol/L — ABNORMAL LOW (ref 22–32)
Calcium: 8.1 mg/dL — ABNORMAL LOW (ref 8.9–10.3)
Chloride: 91 mmol/L — ABNORMAL LOW (ref 98–111)
Creatinine, Ser: 1.17 mg/dL — ABNORMAL HIGH (ref 0.44–1.00)
GFR, Estimated: 54 mL/min — ABNORMAL LOW (ref >60)
Glucose, Bld: 85 mg/dL (ref 70–99)
Potassium: 4.3 mmol/L (ref 3.5–5.1)
Sodium: 124 mmol/L — ABNORMAL LOW (ref 135–145)
Total Bilirubin: 1 mg/dL (ref 0.3–1.2)
Total Protein: 7.7 g/dL (ref 6.5–8.1)

## 2021-07-31 LAB — CBC WITH DIFFERENTIAL/PLATELET
Abs Immature Granulocytes: 0.14 10*3/uL — ABNORMAL HIGH (ref 0.00–0.07)
Basophils Absolute: 0.1 10*3/uL (ref 0.0–0.1)
Basophils Relative: 1 %
Eosinophils Absolute: 0 10*3/uL (ref 0.0–0.5)
Eosinophils Relative: 0 %
HCT: 40.6 % (ref 36.0–46.0)
Hemoglobin: 12.6 g/dL (ref 12.0–15.0)
Immature Granulocytes: 1 %
Lymphocytes Relative: 6 %
Lymphs Abs: 0.9 10*3/uL (ref 0.7–4.0)
MCH: 27.7 pg (ref 26.0–34.0)
MCHC: 31 g/dL (ref 30.0–36.0)
MCV: 89.2 fL (ref 80.0–100.0)
Monocytes Absolute: 1.2 10*3/uL — ABNORMAL HIGH (ref 0.1–1.0)
Monocytes Relative: 8 %
Neutro Abs: 13.5 10*3/uL — ABNORMAL HIGH (ref 1.7–7.7)
Neutrophils Relative %: 84 %
Platelets: 218 10*3/uL (ref 150–400)
RBC: 4.55 MIL/uL (ref 3.87–5.11)
RDW: 12.9 % (ref 11.5–15.5)
WBC: 15.9 10*3/uL — ABNORMAL HIGH (ref 4.0–10.5)
nRBC: 0 % (ref 0.0–0.2)

## 2021-07-31 LAB — HIV ANTIBODY (ROUTINE TESTING W REFLEX): HIV Screen 4th Generation wRfx: NONREACTIVE

## 2021-07-31 LAB — LACTIC ACID, PLASMA
Lactic Acid, Venous: 1.2 mmol/L (ref 0.5–1.9)
Lactic Acid, Venous: 0.8 mmol/L (ref 0.5–1.9)

## 2021-07-31 LAB — SARS CORONAVIRUS 2 BY RT PCR (HOSPITAL ORDER, PERFORMED IN ~~LOC~~ HOSPITAL LAB): SARS Coronavirus 2: NEGATIVE

## 2021-07-31 SURGERY — CYSTOURETEROSCOPY, WITH RETROGRADE PYELOGRAM AND STENT INSERTION
Anesthesia: General | Laterality: Left

## 2021-07-31 MED ORDER — ENOXAPARIN SODIUM 40 MG/0.4ML IJ SOSY
40.0000 mg | PREFILLED_SYRINGE | INTRAMUSCULAR | Status: DC
  Administered 2021-07-31 – 2021-08-01 (×2): 40 mg via SUBCUTANEOUS
  Filled 2021-07-31 (×2): qty 0.4

## 2021-07-31 MED ORDER — ONDANSETRON HCL 4 MG/2ML IJ SOLN
INTRAMUSCULAR | Status: DC | PRN
  Administered 2021-07-31: 4 mg via INTRAVENOUS

## 2021-07-31 MED ORDER — FENTANYL CITRATE (PF) 100 MCG/2ML IJ SOLN
INTRAMUSCULAR | Status: DC | PRN
  Administered 2021-07-31: 100 ug via INTRAVENOUS

## 2021-07-31 MED ORDER — MIDAZOLAM HCL 2 MG/2ML IJ SOLN
INTRAMUSCULAR | Status: AC
  Filled 2021-07-31: qty 2

## 2021-07-31 MED ORDER — OXYCODONE HCL 5 MG/5ML PO SOLN: 5.0000 mg | Freq: Once | ORAL | Status: DC | PRN

## 2021-07-31 MED ORDER — ZOLPIDEM TARTRATE 5 MG PO TABS: 5.0000 mg | ORAL_TABLET | Freq: Every evening | ORAL | Status: DC | PRN

## 2021-07-31 MED ORDER — OXYCODONE HCL 5 MG PO TABS: 5.0000 mg | ORAL_TABLET | Freq: Once | ORAL | Status: DC | PRN

## 2021-07-31 MED ORDER — ACETAMINOPHEN 325 MG PO TABS: 650.0000 mg | ORAL_TABLET | ORAL | Status: DC | PRN

## 2021-07-31 MED ORDER — PROMETHAZINE HCL 25 MG/ML IJ SOLN
INTRAMUSCULAR | Status: AC
  Administered 2021-07-31: 6.25 mg via INTRAVENOUS
  Filled 2021-07-31: qty 1

## 2021-07-31 MED ORDER — SODIUM CHLORIDE 0.9 % IV SOLN
1.0000 g | Freq: Once | INTRAVENOUS | Status: DC
  Filled 2021-07-31: qty 1

## 2021-07-31 MED ORDER — ONDANSETRON HCL 4 MG/2ML IJ SOLN: 4.0000 mg | INTRAMUSCULAR | Status: DC | PRN

## 2021-07-31 MED ORDER — FLEET ENEMA 7-19 GM/118ML RE ENEM: 1.0000 | ENEMA | Freq: Once | RECTAL | Status: DC | PRN

## 2021-07-31 MED ORDER — PROPOFOL 10 MG/ML IV BOLUS
INTRAVENOUS | Status: AC
  Filled 2021-07-31: qty 20

## 2021-07-31 MED ORDER — DEXAMETHASONE SODIUM PHOSPHATE 10 MG/ML IJ SOLN
INTRAMUSCULAR | Status: AC
  Filled 2021-07-31: qty 1

## 2021-07-31 MED ORDER — HYDROMORPHONE HCL 1 MG/ML IJ SOLN: 0.5000 mg | INTRAMUSCULAR | Status: DC | PRN

## 2021-07-31 MED ORDER — BISACODYL 10 MG RE SUPP: 10.0000 mg | Freq: Every day | RECTAL | Status: DC | PRN

## 2021-07-31 MED ORDER — ROCURONIUM BROMIDE 10 MG/ML (PF) SYRINGE
PREFILLED_SYRINGE | INTRAVENOUS | Status: DC | PRN
  Administered 2021-07-31: 50 mg via INTRAVENOUS

## 2021-07-31 MED ORDER — LEVOTHYROXINE SODIUM 112 MCG PO TABS
112.0000 ug | ORAL_TABLET | Freq: Every day | ORAL | Status: DC
  Administered 2021-08-01 – 2021-08-02 (×2): 112 ug via ORAL
  Filled 2021-07-31 (×2): qty 1

## 2021-07-31 MED ORDER — SODIUM CHLORIDE 0.9 % IR SOLN
Status: DC | PRN
  Administered 2021-07-31: 3000 mL via INTRAVESICAL

## 2021-07-31 MED ORDER — CHLORHEXIDINE GLUCONATE 0.12 % MT SOLN
15.0000 mL | Freq: Once | OROMUCOSAL | Status: AC
  Administered 2021-07-31: 15 mL via OROMUCOSAL

## 2021-07-31 MED ORDER — BIMATOPROST 0.03 % EX SOLN
1.0000 | Freq: Every day | CUTANEOUS | Status: DC
Start: 2021-07-31 — End: 2021-08-02

## 2021-07-31 MED ORDER — SUGAMMADEX SODIUM 500 MG/5ML IV SOLN
INTRAVENOUS | Status: DC | PRN
  Administered 2021-07-31: 300 mg via INTRAVENOUS
  Administered 2021-07-31: 100 mg via INTRAVENOUS

## 2021-07-31 MED ORDER — SODIUM CHLORIDE 0.9 % IV SOLN
2.0000 g | INTRAVENOUS | Status: AC
  Administered 2021-07-31: 2 g via INTRAVENOUS

## 2021-07-31 MED ORDER — POTASSIUM CHLORIDE IN NACL 20-0.45 MEQ/L-% IV SOLN
INTRAVENOUS | Status: DC
  Filled 2021-07-31: qty 1000

## 2021-07-31 MED ORDER — PROPOFOL 10 MG/ML IV BOLUS
INTRAVENOUS | Status: DC | PRN
  Administered 2021-07-31: 130 mg via INTRAVENOUS

## 2021-07-31 MED ORDER — HYDROCHLOROTHIAZIDE 25 MG PO TABS: 25.0000 mg | ORAL_TABLET | Freq: Every morning | ORAL | Status: DC

## 2021-07-31 MED ORDER — SODIUM CHLORIDE 0.9 % IV SOLN
INTRAVENOUS | Status: AC
  Filled 2021-07-31: qty 20

## 2021-07-31 MED ORDER — FENTANYL CITRATE PF 50 MCG/ML IJ SOSY: 25.0000 ug | PREFILLED_SYRINGE | INTRAMUSCULAR | Status: DC | PRN

## 2021-07-31 MED ORDER — ONDANSETRON HCL 4 MG/2ML IJ SOLN
INTRAMUSCULAR | Status: AC
  Filled 2021-07-31: qty 2

## 2021-07-31 MED ORDER — FENTANYL CITRATE (PF) 250 MCG/5ML IJ SOLN
INTRAMUSCULAR | Status: AC
  Filled 2021-07-31: qty 5

## 2021-07-31 MED ORDER — TAMSULOSIN HCL 0.4 MG PO CAPS
0.4000 mg | ORAL_CAPSULE | Freq: Every morning | ORAL | Status: DC
  Administered 2021-08-01: 0.4 mg via ORAL
  Filled 2021-07-31: qty 1

## 2021-07-31 MED ORDER — LIDOCAINE 2% (20 MG/ML) 5 ML SYRINGE
INTRAMUSCULAR | Status: DC | PRN
  Administered 2021-07-31: 80 mg via INTRAVENOUS

## 2021-07-31 MED ORDER — VENLAFAXINE HCL ER 150 MG PO CP24
150.0000 mg | ORAL_CAPSULE | Freq: Two times a day (BID) | ORAL | Status: DC
  Administered 2021-07-31 – 2021-08-01 (×3): 150 mg via ORAL
  Filled 2021-07-31 (×3): qty 1

## 2021-07-31 MED ORDER — SODIUM CHLORIDE 0.9 % IV SOLN
1.0000 g | Freq: Three times a day (TID) | INTRAVENOUS | Status: DC
  Administered 2021-07-31 – 2021-08-02 (×5): 1 g via INTRAVENOUS
  Filled 2021-07-31 (×6): qty 1

## 2021-07-31 MED ORDER — AMISULPRIDE (ANTIEMETIC) 5 MG/2ML IV SOLN: 10.0000 mg | Freq: Once | INTRAVENOUS | Status: DC | PRN

## 2021-07-31 MED ORDER — DEXAMETHASONE SODIUM PHOSPHATE 10 MG/ML IJ SOLN
INTRAMUSCULAR | Status: DC | PRN
  Administered 2021-07-31: 10 mg via INTRAVENOUS

## 2021-07-31 MED ORDER — LACTATED RINGERS IV SOLN: INTRAVENOUS | Status: DC

## 2021-07-31 MED ORDER — MIDAZOLAM HCL 5 MG/5ML IJ SOLN
INTRAMUSCULAR | Status: DC | PRN
  Administered 2021-07-31: 2 mg via INTRAVENOUS

## 2021-07-31 MED ORDER — OXYCODONE HCL 5 MG PO TABS
5.0000 mg | ORAL_TABLET | ORAL | Status: DC | PRN
  Administered 2021-08-02: 5 mg via ORAL
  Filled 2021-07-31: qty 1

## 2021-07-31 MED ORDER — PROMETHAZINE HCL 25 MG/ML IJ SOLN: 6.2500 mg | INTRAMUSCULAR | Status: DC | PRN

## 2021-07-31 MED ORDER — SENNOSIDES-DOCUSATE SODIUM 8.6-50 MG PO TABS: 1.0000 | ORAL_TABLET | Freq: Every evening | ORAL | Status: DC | PRN

## 2021-07-31 MED ORDER — ACETAMINOPHEN 500 MG PO TABS
1000.0000 mg | ORAL_TABLET | Freq: Once | ORAL | Status: AC
  Administered 2021-07-31: 1000 mg via ORAL
  Filled 2021-07-31: qty 2

## 2021-07-31 SURGICAL SUPPLY — 23 items
BAG URO CATCHER STRL LF (MISCELLANEOUS) ×3 IMPLANT
BASKET STONE NCOMPASS (UROLOGICAL SUPPLIES) IMPLANT
CATH URETERAL DUAL LUMEN 10F (MISCELLANEOUS) IMPLANT
CATH URETL OPEN 5X70 (CATHETERS) IMPLANT
CLOTH BEACON ORANGE TIMEOUT ST (SAFETY) ×3 IMPLANT
EXTRACTOR STONE NITINOL NGAGE (UROLOGICAL SUPPLIES) IMPLANT
GLOVE SURG POLYISO LF SZ8 (GLOVE) ×3 IMPLANT
GOWN STRL REUS W/TWL XL LVL3 (GOWN DISPOSABLE) ×3 IMPLANT
GUIDEWIRE STR DUAL SENSOR (WIRE) ×3 IMPLANT
IV NS IRRIG 3000ML ARTHROMATIC (IV SOLUTION) ×3 IMPLANT
KIT TURNOVER KIT A (KITS) IMPLANT
LASER FIB FLEXIVA PULSE ID 365 (Laser) IMPLANT
LASER FIB FLEXIVA PULSE ID 550 (Laser) IMPLANT
LASER FIB FLEXIVA PULSE ID 910 (Laser) IMPLANT
MANIFOLD NEPTUNE II (INSTRUMENTS) ×3 IMPLANT
PACK CYSTO (CUSTOM PROCEDURE TRAY) ×3 IMPLANT
SHEATH NAVIGATOR HD 11/13X36 (SHEATH) IMPLANT
STENT URET 6FRX24 CONTOUR (STENTS) ×3 IMPLANT
TRACTIP FLEXIVA PULS ID 200XHI (Laser) IMPLANT
TRACTIP FLEXIVA PULSE ID 200 (Laser)
TUBING CONNECTING 10 (TUBING) ×2 IMPLANT
TUBING CONNECTING 10' (TUBING) ×1
TUBING UROLOGY SET (TUBING) ×3 IMPLANT

## 2021-07-31 NOTE — Discharge Instructions (Addendum)
You can remove the stent by pulling the attached string on Thursday morning.   You can stop the tamsulosin on Monday.

## 2021-07-31 NOTE — Anesthesia Preprocedure Evaluation (Addendum)
Anesthesia Evaluation  Patient identified by MRN, date of birth, ID band Patient awake    Reviewed: Allergy & Precautions, NPO status , Patient's Chart, lab work & pertinent test results  History of Anesthesia Complications (+) PONV  Airway Mallampati: II  TM Distance: >3 FB Neck ROM: Full    Dental no notable dental hx.    Pulmonary    Pulmonary exam normal breath sounds clear to auscultation       Cardiovascular Exercise Tolerance: Good hypertension, Pt. on medications Normal cardiovascular exam Rhythm:Regular Rate:Normal     Neuro/Psych PSYCHIATRIC DISORDERS Depression    GI/Hepatic   Endo/Other  Hypothyroidism   Renal/GU      Musculoskeletal   Abdominal   Peds negative pediatric ROS (+)  Hematology   Anesthesia Other Findings   Reproductive/Obstetrics                            Anesthesia Physical Anesthesia Plan  ASA: 2 and emergent  Anesthesia Plan: General   Post-op Pain Management:    Induction:   PONV Risk Score and Plan: 3 and Treatment may vary due to age or medical condition, Ondansetron, Dexamethasone and Midazolam  Airway Management Planned: Oral ETT  Additional Equipment: None  Intra-op Plan:   Post-operative Plan: Extubation in OR  Informed Consent: I have reviewed the patients History and Physical, chart, labs and discussed the procedure including the risks, benefits and alternatives for the proposed anesthesia with the patient or authorized representative who has indicated his/her understanding and acceptance.     Dental advisory given  Plan Discussed with: CRNA and Anesthesiologist  Anesthesia Plan Comments:        Anesthesia Quick Evaluation

## 2021-07-31 NOTE — Anesthesia Postprocedure Evaluation (Signed)
Anesthesia Post Note  Patient: Jacqueline Finley  Procedure(s) Performed: CYSTOSCOPY WITH RETROGRADE PYELOGRAM, URETEROSCOPY AND STENT PLACEMENT (Left)     Patient location during evaluation: PACU Anesthesia Type: General Level of consciousness: awake Pain management: pain level controlled Vital Signs Assessment: post-procedure vital signs reviewed and stable Respiratory status: spontaneous breathing and respiratory function stable Cardiovascular status: stable Postop Assessment: no apparent nausea or vomiting Anesthetic complications: no   No notable events documented.  Last Vitals:  Vitals:   07/31/21 1845 07/31/21 1900  BP: 109/71 114/76  Pulse: (!) 107 100  Resp: 18 19  Temp: 37.4 C   SpO2: 96% 96%    Last Pain:  Vitals:   07/31/21 1900  TempSrc:   PainSc: 0-No pain                 Merlinda Frederick

## 2021-07-31 NOTE — H&P (Signed)
Subjective:  Jacqueline Finley was seen in the office today for a fever to 102 and recurrent left flank pain with a known 91mm left UVJ stone on CT from 07/29/21 when she was initially seen for pain. She has had chills but was afebrile in the office today.  Her UA was infected  in appearance and she was given rocephin 1gm.  She had a KUB but the stones wasn't seen but on CT it was only about 200HU.   She is to be admitted for cystoscopy and stent along with IV antibiotics.     ROS:  Review of Systems  Constitutional:  Positive for chills, fever and malaise/fatigue.  Genitourinary:  Positive for flank pain.  All other systems reviewed and are negative.  Allergies  Allergen Reactions   Morphine Nausea Only   Scopolamine Other (See Comments)    Accute angle closure glaucoma    Past Medical History:  Diagnosis Date   Acute angle-closure glaucoma 2015   from scopalamine patch   Depression    Hypothyroidism    PONV (postoperative nausea and vomiting)     Past Surgical History:  Procedure Laterality Date   CESAREAN SECTION     INCISION AND DRAINAGE     due to C-section   ORIF FINGER FRACTURE Right    4th digit   PARATHYROIDECTOMY      Social History   Socioeconomic History   Marital status: Divorced    Spouse name: Not on file   Number of children: Not on file   Years of education: Not on file   Highest education level: Not on file  Occupational History   Occupation: CLINICAL DIETITIAN    Employer: [employer]  Tobacco Use   Smoking status: Never   Smokeless tobacco: Never  Vaping Use   Vaping Use: Never used  Substance and Sexual Activity   Alcohol use: Yes    Comment: occ   Drug use: No   Sexual activity: Yes    Birth control/protection: None  Other Topics Concern   Not on file  Social History Narrative   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 [employer]   Two children   Social Determinants of Health   Financial Resource Strain: Not on file  Food Insecurity: Not on file   Transportation Needs: Not on file  Physical Activity: Not on file  Stress: Not on file  Social Connections: Not on file  Intimate Partner Violence: Not on file    Family History  Problem Relation Age of Onset   Uterine cancer Mother 54   Heart attack Father    Hypertension Father    Prostate cancer Father    Colon cancer Neg Hx    Breast cancer Neg Hx     Anti-infectives: Anti-infectives (From admission, onward)    Start     Dose/Rate Route Frequency Ordered Stop   07/31/21 1634  sodium chloride 0.9 % with cefTRIAXone (ROCEPHIN) ADS Med       Note to Pharmacy: Wynona Luna   : cabinet override      07/31/21 1634 08/01/21 0444       Current Facility-Administered Medications  Medication Dose Route Frequency Provider Last Rate Last Admin   acetaminophen (TYLENOL) tablet 1,000 mg  1,000 mg Oral Once Merlinda Frederick, MD       sodium chloride 0.9 % with cefTRIAXone (ROCEPHIN) ADS Med              Objective: Vital signs in last 24 hours: BP (!) 151/125  Pulse (!) 101   Temp 98.2 F (36.8 C) (Oral)   Resp 16   Wt 73 kg   SpO2 98%   BMI 27.21 kg/m   Intake/Output from previous day: No intake/output data recorded. Intake/Output this shift: No intake/output data recorded.   Physical Exam Vitals reviewed.  Constitutional:      Appearance: Normal appearance.  Cardiovascular:     Rate and Rhythm: Normal rate and regular rhythm.  Pulmonary:     Effort: Pulmonary effort is normal. No respiratory distress.     Breath sounds: Normal breath sounds.  Abdominal:     General: Abdomen is flat.     Palpations: Abdomen is soft.     Tenderness: There is abdominal tenderness (LLQ). There is left CVA tenderness.  Musculoskeletal:        General: No swelling. Normal range of motion.  Skin:    General: Skin is warm and dry.  Neurological:     General: No focal deficit present.     Mental Status: She is alert and oriented to person, place, and time.  Psychiatric:         Mood and Affect: Mood normal.        Behavior: Behavior normal.    Lab Results:  No results found for this or any previous visit (from the past 24 hour(s)).  BMET No results for input(s): NA, K, CL, CO2, GLUCOSE, BUN, CREATININE, CALCIUM in the last 72 hours. PT/INR No results for input(s): LABPROT, INR in the last 72 hours. ABG No results for input(s): PHART, HCO3 in the last 72 hours.  Invalid input(s): PCO2, PO2  Studies/Results: No results found. I have reviewed the CT films and report and the KUB film from today in our office.  I have review her UA from today which was Nit + with WBC and bacteria.   Assessment/Plan: Left UVJ stone with febrile UTI.   I will place a stent and admit for antibiotic therapy.    Meds ordered this encounter  Medications   acetaminophen (TYLENOL) tablet 1,000 mg   sodium chloride 0.9 % with cefTRIAXone (ROCEPHIN) ADS Med    Duffey, Angela   : cabinet override     Orders Placed This Encounter  Procedures   SARS Coronavirus 2 by RT PCR (hospital order, performed in [place] hospital lab) Nasopharyngeal Nasopharyngeal Swab    Stat preop    Standing Status:   Standing    Number of Occurrences:   1    Order Specific Question:   Previously tested for COVID-19    Answer:   No    Order Specific Question:   Resident in a congregate (group) care setting    Answer:   No    Order Specific Question:   Employed in healthcare setting    Answer:   Unknown    Order Specific Question:   Pregnant    Answer:   No    Order Specific Question:   Has patient completed COVID vaccination(s) (2 doses of Pfizer/Moderna 1 dose of [employer])    Answer:   Unknown   CBC with Differential/Platelet    Standing Status:   Standing    Number of Occurrences:   1   Comprehensive metabolic panel    Standing Status:   Standing    Number of Occurrences:   1   Lactic acid, plasma    Standing Status:   Standing    Number of Occurrences:   2  No follow-ups  on file.         [name] 07/31/2021 [phone]

## 2021-07-31 NOTE — Progress Notes (Signed)
Pharmacy Antibiotic Note  Jacqueline Finley is a 55 y.o. female admitted on 07/31/2021 with fever and flank pain.  Pharmacy has been consulted for meropenem dosing for urosepsis.   Plan: Meropenem 1 gm IV q8h  Weight: 73 kg (161 lb)  Temp (24hrs), Avg:98.2 F (36.8 C), Min:98.2 F (36.8 C), Max:98.2 F (36.8 C)  Recent Labs  Lab 07/31/21 1641  WBC 15.9*  CREATININE 1.17*    Estimated Creatinine Clearance: 52.5 mL/min (A) (by C-G formula based on SCr of 1.17 mg/dL (H)).    Allergies  Allergen Reactions   Morphine Nausea Only   Scopolamine Other (See Comments)    Accute angle closure glaucoma   Thank you for allowing pharmacy to be a part of this patient's care.  Eudelia Bunch, Pharm.D 07/31/2021 6:37 PM

## 2021-07-31 NOTE — Op Note (Signed)
Procedure: 1.  Cystoscopy with left retrograde pyelogram and interpretation. 2. Left diagnostic ureteroscopy. 3.  Insertion of left double-J stent. 4.  Application of fluoroscopy.  Preop diagnosis: Left distal ureteral stone with sepsis.  Postop diagnosis: Probable interval passage of left distal ureteral stone with sepsis and changes consistent with chronic follicular cystitis.  Surgeon: Dr. [name].  Anesthesia: General.  Specimen: None.  Drain: 6 [place] by 24 cm left double-J stent with tether.  Foley catheter.  EBL: None.  Complications: None.  Indications: The patient is a 55 year old female who presented to the office last Friday with left flank pain.  CT scan demonstrated a 5 mm left distal ureteral stone.  She returned to the office today with a history of fevers to 102 and persistent pain.  A KUB in the office did not demonstrate a stone but on review of her CT the stone density is only 200 Hounsfield units.  With the fever and persistent pain it was felt that cystoscopy and stenting was indicated.  Procedure: She was given Rocephin in the office.  She was taken operating room where general anesthetic was induced.  She was placed in lithotomy position and fitted with PAS hose.  Her perineum and genitalia were prepped with Betadine solution she was draped in usual sterile fashion.  Cystoscopy was performed using a 21 [place] scope and 30 degree lens.  Examination revealed some turbid urine requiring bladder irrigation.  Inspection of the bladder wall demonstrated diffuse changes consistent with chronic follicular cystitis.  There was also some changes consistent with cystitis cystica.  The right ureteral orifice was unremarkable.  The left ureteral orifice had some perimeatal inflammatory changes.  The left retrograde pyelogram was performed using a 5 [place] open-ended catheter and Omnipaque.    On the retrograde pyelogram there was a vague filling defect at the very distal  ureter suggestive of her stone but there was no proximal dilation.  After completion of the retrograde pyelogram there was noted to be a plug of inspissated purulent material in the bladder but no stone was obviously seen.  Prior to removal of the open-ended catheter a sensor wire was advanced to the kidney under fluoroscopic guidance.  Because of the presence of the plug of the inspissated material and the question as to whether there was a persistent stone, the single-lumen semirigid ureteroscope was passed gently into the distal ureter and no stone was seen.  Despite the lack of the stone with the inspissated purulent material I felt that at least temporary stenting was indicated to ensure renal decompression because of the presence of possible sepsis.  A 6 French by 24 cm contour double-J stent was advanced over the wire without difficulty to the kidney under fluoroscopic guidance.  The wire was removed leaving a good coil in the kidney and a good coil in the bladder.  The stent string was left exiting the urethra.  It was tied close to the meatus and trimmed to appropriate length and tucked vaginally.  A 16 French Foley catheter was then inserted and the balloon was filled with 10 mL of sterile fluid.  The catheter was placed to straight drainage.  She was taken down from lithotomy position, her anesthetic was reversed and she was moved recovery in stable condition.  She will be admitted for IV antibiotics.

## 2021-07-31 NOTE — Anesthesia Procedure Notes (Signed)
Procedure Name: Intubation Date/Time: 07/31/2021 5:26 PM Performed by: Lissa Morales, CRNA Pre-anesthesia Checklist: Patient identified, Emergency Drugs available, Suction available and Patient being monitored Patient Re-evaluated:Patient Re-evaluated prior to induction Oxygen Delivery Method: Circle system utilized Preoxygenation: Pre-oxygenation with 100% oxygen Induction Type: IV induction Ventilation: Mask ventilation without difficulty Grade View: Grade I Tube type: Oral Tube size: 7.5 mm Number of attempts: 1 Airway Equipment and Method: Stylet and Oral airway Placement Confirmation: ETT inserted through vocal cords under direct vision, positive ETCO2 and breath sounds checked- equal and bilateral Secured at: 22 cm Tube secured with: Tape Dental Injury: Teeth and Oropharynx as per pre-operative assessment

## 2021-08-01 ENCOUNTER — Encounter (HOSPITAL_COMMUNITY): Payer: Self-pay | Admitting: Urology

## 2021-08-01 DIAGNOSIS — E8809 Other disorders of plasma-protein metabolism, not elsewhere classified: Secondary | ICD-10-CM | POA: Diagnosis present

## 2021-08-01 DIAGNOSIS — Z87442 Personal history of urinary calculi: Secondary | ICD-10-CM | POA: Diagnosis not present

## 2021-08-01 DIAGNOSIS — A4159 Other Gram-negative sepsis: Secondary | ICD-10-CM | POA: Diagnosis present

## 2021-08-01 DIAGNOSIS — Z8249 Family history of ischemic heart disease and other diseases of the circulatory system: Secondary | ICD-10-CM | POA: Diagnosis not present

## 2021-08-01 DIAGNOSIS — E892 Postprocedural hypoparathyroidism: Secondary | ICD-10-CM | POA: Diagnosis present

## 2021-08-01 DIAGNOSIS — E039 Hypothyroidism, unspecified: Secondary | ICD-10-CM | POA: Diagnosis present

## 2021-08-01 DIAGNOSIS — Z79890 Hormone replacement therapy (postmenopausal): Secondary | ICD-10-CM | POA: Diagnosis not present

## 2021-08-01 DIAGNOSIS — B961 Klebsiella pneumoniae [K. pneumoniae] as the cause of diseases classified elsewhere: Secondary | ICD-10-CM | POA: Diagnosis present

## 2021-08-01 DIAGNOSIS — N201 Calculus of ureter: Secondary | ICD-10-CM | POA: Diagnosis present

## 2021-08-01 DIAGNOSIS — N12 Tubulo-interstitial nephritis, not specified as acute or chronic: Secondary | ICD-10-CM

## 2021-08-01 DIAGNOSIS — I10 Essential (primary) hypertension: Secondary | ICD-10-CM | POA: Diagnosis present

## 2021-08-01 DIAGNOSIS — H4020X0 Unspecified primary angle-closure glaucoma, stage unspecified: Secondary | ICD-10-CM | POA: Diagnosis present

## 2021-08-01 DIAGNOSIS — E876 Hypokalemia: Secondary | ICD-10-CM | POA: Diagnosis present

## 2021-08-01 DIAGNOSIS — F32A Depression, unspecified: Secondary | ICD-10-CM | POA: Diagnosis present

## 2021-08-01 DIAGNOSIS — Z7951 Long term (current) use of inhaled steroids: Secondary | ICD-10-CM | POA: Diagnosis not present

## 2021-08-01 DIAGNOSIS — N3080 Other cystitis without hematuria: Secondary | ICD-10-CM | POA: Diagnosis present

## 2021-08-01 DIAGNOSIS — Z79899 Other long term (current) drug therapy: Secondary | ICD-10-CM | POA: Diagnosis not present

## 2021-08-01 DIAGNOSIS — E871 Hypo-osmolality and hyponatremia: Secondary | ICD-10-CM | POA: Diagnosis present

## 2021-08-01 DIAGNOSIS — Z20822 Contact with and (suspected) exposure to covid-19: Secondary | ICD-10-CM | POA: Diagnosis present

## 2021-08-01 DIAGNOSIS — Z888 Allergy status to other drugs, medicaments and biological substances status: Secondary | ICD-10-CM | POA: Diagnosis not present

## 2021-08-01 DIAGNOSIS — K219 Gastro-esophageal reflux disease without esophagitis: Secondary | ICD-10-CM | POA: Insufficient documentation

## 2021-08-01 DIAGNOSIS — Z885 Allergy status to narcotic agent status: Secondary | ICD-10-CM | POA: Diagnosis not present

## 2021-08-01 LAB — CBC
HCT: 37.5 % (ref 36.0–46.0)
Hemoglobin: 12.6 g/dL (ref 12.0–15.0)
MCH: 27.6 pg (ref 26.0–34.0)
MCHC: 33.6 g/dL (ref 30.0–36.0)
MCV: 82.1 fL (ref 80.0–100.0)
Platelets: 243 10*3/uL (ref 150–400)
RBC: 4.57 MIL/uL (ref 3.87–5.11)
RDW: 13 % (ref 11.5–15.5)
WBC: 10.3 10*3/uL (ref 4.0–10.5)
nRBC: 0 % (ref 0.0–0.2)

## 2021-08-01 LAB — HEPATIC FUNCTION PANEL
ALT: 25 U/L (ref 0–44)
AST: 24 U/L (ref 15–41)
Albumin: 2.8 g/dL — ABNORMAL LOW (ref 3.5–5.0)
Alkaline Phosphatase: 62 U/L (ref 38–126)
Bilirubin, Direct: 0.1 mg/dL (ref 0.0–0.2)
Indirect Bilirubin: 0.1 mg/dL — ABNORMAL LOW (ref 0.3–0.9)
Total Bilirubin: 0.2 mg/dL — ABNORMAL LOW (ref 0.3–1.2)
Total Protein: 7.2 g/dL (ref 6.5–8.1)

## 2021-08-01 LAB — BASIC METABOLIC PANEL
Anion gap: 10 (ref 5–15)
BUN: 17 mg/dL (ref 6–20)
CO2: 24 mmol/L (ref 22–32)
Calcium: 8.2 mg/dL — ABNORMAL LOW (ref 8.9–10.3)
Chloride: 95 mmol/L — ABNORMAL LOW (ref 98–111)
Creatinine, Ser: 0.68 mg/dL (ref 0.44–1.00)
GFR, Estimated: >60 mL/min (ref >60)
Glucose, Bld: 171 mg/dL — ABNORMAL HIGH (ref 70–99)
Potassium: 3.3 mmol/L — ABNORMAL LOW (ref 3.5–5.1)
Sodium: 129 mmol/L — ABNORMAL LOW (ref 135–145)

## 2021-08-01 LAB — MAGNESIUM: Magnesium: 2.3 mg/dL (ref 1.7–2.4)

## 2021-08-01 LAB — PHOSPHORUS: Phosphorus: 3.3 mg/dL (ref 2.5–4.6)

## 2021-08-01 MED ORDER — POTASSIUM CHLORIDE IN NACL 20-0.9 MEQ/L-% IV SOLN
INTRAVENOUS | Status: DC
  Filled 2021-08-01: qty 1000

## 2021-08-01 MED ORDER — MAGNESIUM SULFATE 2 GM/50ML IV SOLN
2.0000 g | Freq: Once | INTRAVENOUS | Status: AC
  Administered 2021-08-01: 2 g via INTRAVENOUS
  Filled 2021-08-01: qty 50

## 2021-08-01 MED ORDER — POTASSIUM CHLORIDE IN NACL 40-0.9 MEQ/L-% IV SOLN
INTRAVENOUS | Status: DC
  Filled 2021-08-01 (×4): qty 1000

## 2021-08-01 NOTE — Transfer of Care (Signed)
Immediate Anesthesia Transfer of Care Note  Patient: Jacqueline Finley  Procedure(s) Performed: CYSTOSCOPY WITH RETROGRADE PYELOGRAM, URETEROSCOPY AND STENT PLACEMENT (Left)  Patient Location: PACU  Anesthesia Type:General  Level of Consciousness: awake, alert , oriented and patient cooperative  Airway & Oxygen Therapy: Patient Spontanous Breathing and Patient connected to face mask oxygen  Post-op Assessment: Report given to RN, Post -op Vital signs reviewed and stable and Patient moving all extremities X 4  Post vital signs: stable  Last Vitals:  Vitals Value Taken Time  BP 133/79 08/01/21 0511  Temp 36.9 C 07/31/21 2011  Pulse 64 08/01/21 0511  Resp 18 08/01/21 0511  SpO2 100 % 08/01/21 0511    Last Pain:  Vitals:   07/31/21 2320  TempSrc:   PainSc: 0-No pain      Patients Stated Pain Goal: 2 (99/77/41 4239)  Complications: No notable events documented.

## 2021-08-01 NOTE — Progress Notes (Addendum)
1 Day Post-Op  Subjective: Jacqueline Finley is feeling better s/p stent insertion.   She has no pain and is afebrile this morning but spiked last night.  She has hyponatremia with a sodium of 129 this am which is improving but she also has hypokalemia with a K of 3.3 which is a decline. .   ROS:  Review of Systems  Constitutional:  Positive for fever.  Gastrointestinal:  Negative for nausea.  Genitourinary:  Negative for flank pain.   Anti-infectives: Anti-infectives (From admission, onward)    Start     Dose/Rate Route Frequency Ordered Stop   07/31/21 2200  meropenem (MERREM) 1 g in sodium chloride 0.9 % 100 mL IVPB        1 g 200 mL/hr over 30 Minutes Intravenous Every 8 hours 07/31/21 1838     07/31/21 1830  meropenem (MERREM) 1 g in sodium chloride 0.9 % 100 mL IVPB        1 g 200 mL/hr over 30 Minutes Intravenous  Once 07/31/21 1821     07/31/21 1654  cefTRIAXone (ROCEPHIN) 2 g in sodium chloride 0.9 % 100 mL IVPB        2 g 200 mL/hr over 30 Minutes Intravenous 30 min pre-op 07/31/21 1654 07/31/21 1718   07/31/21 1634  sodium chloride 0.9 % with cefTRIAXone (ROCEPHIN) ADS Med       Note to Pharmacy: Wynona Luna   : cabinet override      07/31/21 1634 08/01/21 0444       Current Facility-Administered Medications  Medication Dose Route Frequency Provider Last Rate Last Admin   0.9 % NaCl with KCl 20 mEq/ L  infusion   Intravenous Continuous [name], MD       acetaminophen (TYLENOL) tablet 650 mg  650 mg Oral Q4H PRN [name], MD       bimatoprost (LATISSE) 0.03 % ophthalmic solution SOLN 1 application  1 application Both Eyes QHS [name], MD       bisacodyl (DULCOLAX) suppository 10 mg  10 mg Rectal Daily PRN [name], MD       enoxaparin (LOVENOX) injection 40 mg  40 mg Subcutaneous Q24H [name], MD   40 mg at 07/31/21 2151   hydrochlorothiazide (HYDRODIURIL) tablet 25 mg  25 mg Oral q morning [name], MD       HYDROmorphone (DILAUDID) injection 0.5-1 mg   0.5-1 mg Intravenous Q2H PRN [name], MD       levothyroxine (SYNTHROID) tablet 112 mcg  112 mcg Oral QAC breakfast [name], MD   112 mcg at 08/01/21 0453   meropenem (MERREM) 1 g in sodium chloride 0.9 % 100 mL IVPB  1 g Intravenous Once Leodis Sias T, RPH       meropenem (MERREM) 1 g in sodium chloride 0.9 % 100 mL IVPB  1 g Intravenous Q8H BellSharyn Lull T, RPH 200 mL/hr at 08/01/21 0454 1 g at 08/01/21 0454   ondansetron (ZOFRAN) injection 4 mg  4 mg Intravenous Q4H PRN [name], MD       oxyCODONE (Oxy IR/ROXICODONE) immediate release tablet 5 mg  5 mg Oral Q4H PRN [name], MD       senna-docusate (Senokot-S) tablet 1 tablet  1 tablet Oral QHS PRN [name], MD       sodium phosphate (FLEET) 7-19 GM/118ML enema 1 enema  1 enema Rectal Once PRN [name], MD       tamsulosin [facility]) capsule  0.4 mg  0.4 mg Oral q morning [name], MD       venlafaxine XR (EFFEXOR-XR) 24 hr capsule 150 mg  150 mg Oral BID [name], MD   150 mg at 07/31/21 2151   zolpidem (AMBIEN) tablet 5 mg  5 mg Oral QHS PRN [name], MD       Facility-Administered Medications Ordered in Other Encounters  Medication Dose Route Frequency Provider Last Rate Last Admin   dexamethasone (DECADRON) injection   Intravenous Anesthesia Intra-op Lissa Morales, CRNA   10 mg at 07/31/21 1737   fentaNYL (SUBLIMAZE) injection   Intravenous Anesthesia Intra-op Lissa Morales, CRNA   100 mcg at 07/31/21 1722   lidocaine 2% (20 mg/mL) 5 mL syringe   Intravenous Anesthesia Intra-op Lissa Morales, CRNA   80 mg at 07/31/21 1725   midazolam (VERSED) 5 MG/5ML injection   Intravenous Anesthesia Intra-op Lissa Morales, CRNA   2 mg at 07/31/21 1713   ondansetron (ZOFRAN) injection   Intravenous Anesthesia Intra-op Lissa Morales, CRNA   4 mg at 07/31/21 1736   propofol (DIPRIVAN) 10 mg/mL bolus/IV push   Intravenous Anesthesia Intra-op Lissa Morales, CRNA   130 mg at 07/31/21 1725   rocuronium bromide 10 mg/mL (PF)  syringe   Intravenous Anesthesia Intra-op Lissa Morales, CRNA   50 mg at 07/31/21 1726   sugammadex sodium (BRIDION) injection   Intravenous Anesthesia Intra-op Lissa Morales, CRNA   100 mg at 07/31/21 1801     Objective: Vital signs in last 24 hours: Temp:  [98.2 F (36.8 C)-101.4 F (38.6 C)] 98.4 F (36.9 C) (11/07 2011) Pulse Rate:  [64-123] 64 (11/08 0511) Resp:  [15-20] 18 (11/08 0511) BP: (102-151)/(68-125) 133/79 (11/08 0511) SpO2:  [96 %-100 %] 100 % (11/08 0511) Weight:  [73 kg-76.4 kg] 76.4 kg (11/07 2200)  Intake/Output from previous day: 11/07 0701 - 11/08 0700 In: 1940 [P.O.:240; I.V.:1500; IV Piggyback:200] Out: 1450 [Urine:1450] Intake/Output this shift: Total I/O In: 840 [P.O.:240; I.V.:500; IV Piggyback:100] Out: 1450 [Urine:1450]   Physical Exam Vitals reviewed.  Constitutional:      Appearance: Normal appearance.  Cardiovascular:     Rate and Rhythm: Normal rate and regular rhythm.  Pulmonary:     Effort: Pulmonary effort is normal. No respiratory distress.  Abdominal:     Palpations: Abdomen is soft.     Tenderness: There is no abdominal tenderness.  Neurological:     Mental Status: She is alert.    Lab Results:  Recent Labs    07/31/21 1641 08/01/21 0430  WBC 15.9* 10.3  HGB 12.6 12.6  HCT 40.6 37.5  PLT 218 243   BMET Recent Labs    07/31/21 1641 08/01/21 0430  NA 124* 129*  K 4.3 3.3*  CL 91* 95*  CO2 18* 24  GLUCOSE 85 171*  BUN 24* 17  CREATININE 1.17* 0.68  CALCIUM 8.1* 8.2*   PT/INR No results for input(s): LABPROT, INR in the last 72 hours. ABG No results for input(s): PHART, HCO3 in the last 72 hours.  Invalid input(s): PCO2, PO2  Studies/Results: DG C-Arm 1-60 Min-No Report  Result Date: 07/31/2021 Fluoroscopy was utilized by the requesting physician.  No radiographic interpretation.     Assessment and Plan: Left distal stone with pyelonephritis doing well s/p stent placement.  Stone had passed but there  was some pus and inflammation so I placed the stent.   I will continue IV antibiotics for  another 24hrs pending the culture in my office.  D/C foley today.  Hyponatremia.  Her sodium was 124 yesterday and up to 129 this am.  I will change to NS and consult the hospitalists.  Hypokalemia.  Her K is 3.3.  I will increase the K in the IV to 98meq.       LOS: 0 days    [name] 08/01/2021 [phone] Patient ID: Thelma Comp, female   DOB: 11-25-1965, 55 y.o.   MRN: [id number]

## 2021-08-01 NOTE — H&P (Signed)
Medical Consultation   Jacqueline Finley  GYB:[id number]  DOB: 1966-03-24  DOA: 07/31/2021  PCP: Inda Coke, PA   Requesting physician: [name], MD  Reason for consultation: Electrolyte abnormalities.  History of Present Illness: Jacqueline Finley is an 55 y.o. female with a past medical history of acute angle glaucoma, depression, hypothyroidism, urolithiasis who was admitted due to sepsis in the setting of pyelonephritis secondary to urolith undergoing cystoscopy for left distal ureteral urolith removal and urinary stent placement yesterday and we are seeing for electrolyte abnormalities.  She feels a lot better today.  Her pain has improved significantly.  No headache, rhinorrhea, sore throat, dyspnea, wheezing or hemoptysis.  She was nauseous at times, but no emesis, diarrhea, melena or hematochezia.  She stated she drinks a lot of fluids.  However, she has never had a low sodium level in the past.  She has being on HCTZ 25 mg p.o. daily for several years with no reported side effects or electrolyte abnormality.  Her review of system is otherwise negative.  Review of Systems:  ROS As per HPI otherwise 10 point review of systems negative.   Past Medical History: Past Medical History:  Diagnosis Date   Acute angle-closure glaucoma 2015   from scopalamine patch   Depression    Hypothyroidism    PONV (postoperative nausea and vomiting)    Past Surgical History: Past Surgical History:  Procedure Laterality Date   CESAREAN SECTION     INCISION AND DRAINAGE     due to C-section   ORIF FINGER FRACTURE Right    4th digit   PARATHYROIDECTOMY     Allergies:   Allergies  Allergen Reactions   Morphine Nausea Only   Scopolamine Other (See Comments)    Accute angle closure glaucoma   Social History:  reports that she has never smoked. She has never used smokeless tobacco. She reports current alcohol use. She reports that she does not use drugs.  Family  History: Family History  Problem Relation Age of Onset   Uterine cancer Mother 18   Heart attack Father    Hypertension Father    Prostate cancer Father    Colon cancer Neg Hx    Breast cancer Neg Hx    Physical Exam: Vitals:   07/31/21 2011 07/31/21 2200 08/01/21 0023 08/01/21 0511  BP: 119/78  138/86 133/79  Pulse: 88  71 64  Resp: 15  16 18   Temp: 98.4 F (36.9 C)     TempSrc: Oral     SpO2: 97%  100% 100%  Weight:  76.4 kg    Height:  5\' 4"  (1.626 m)     Constitutional: Alert and awake, oriented x3, not in any acute distress. Eyes: PERLA, EOMI, irises appear normal, anicteric sclera,  ENMT: external ears and nose appear normal,             Lips appears normal, oropharynx mucosa, tongue, posterior pharynx appear normal  Neck: neck appears normal, no masses, normal ROM, no thyromegaly, no JVD  CVS: S1-S2 clear, no murmur rubs or gallops, no LE edema, normal pedal pulses  Respiratory:  clear to auscultation bilaterally, no wheezing, rales or rhonchi. Respiratory effort normal. No accessory muscle use.  Abdomen: soft, nontender, nondistended, normal bowel sounds, no hepatosplenomegaly, no hernias  Musculoskeletal: : no cyanosis, clubbing or edema noted bilaterally Neuro: Cranial nerves II-XII intact, strength, sensation, reflexes Psych: judgement and insight appear  normal, stable mood and affect, mental status Skin: no rashes or lesions or ulcers, no induration or nodules   Data reviewed:  I have personally reviewed following labs and imaging studies Labs:  CBC: Recent Labs  Lab 07/31/21 1641 08/01/21 0430  WBC 15.9* 10.3  NEUTROABS 13.5*  --   HGB 12.6 12.6  HCT 40.6 37.5  MCV 89.2 82.1  PLT 218 376   Basic Metabolic Panel: Recent Labs  Lab 07/31/21 1641 08/01/21 0430  NA 124* 129*  K 4.3 3.3*  CL 91* 95*  CO2 18* 24  GLUCOSE 85 171*  BUN 24* 17  CREATININE 1.17* 0.68  CALCIUM 8.1* 8.2*  MG  --  2.3  PHOS  --  3.3   GFR Estimated Creatinine  Clearance: 77.7 mL/min (by C-G formula based on SCr of 0.68 mg/dL). Liver Function Tests: Recent Labs  Lab 07/31/21 1641 08/01/21 0430  AST 29 24  ALT 20 25  ALKPHOS 69 62  BILITOT 1.0 0.2*  PROT 7.7 7.2  ALBUMIN 3.5 2.8*   No results for input(s): LIPASE, AMYLASE in the last 168 hours. No results for input(s): AMMONIA in the last 168 hours. Coagulation profile No results for input(s): INR, PROTIME in the last 168 hours.  Cardiac Enzymes: No results for input(s): CKTOTAL, CKMB, CKMBINDEX, TROPONINI in the last 168 hours. BNP: Invalid input(s): POCBNP CBG: No results for input(s): GLUCAP in the last 168 hours. D-Dimer No results for input(s): DDIMER in the last 72 hours. Hgb A1c No results for input(s): HGBA1C in the last 72 hours. Lipid Profile No results for input(s): CHOL, HDL, LDLCALC, TRIG, CHOLHDL, LDLDIRECT in the last 72 hours. Thyroid function studies No results for input(s): TSH, T4TOTAL, T3FREE, THYROIDAB in the last 72 hours.  Invalid input(s): FREET3 Anemia work up No results for input(s): VITAMINB12, FOLATE, FERRITIN, TIBC, IRON, RETICCTPCT in the last 72 hours. Urinalysis No results found for: COLORURINE, APPEARANCEUR, [place], [place], [place], [place], [place], [place], PROTEINUR, UROBILINOGEN, NITRITE, LEUKOCYTESUR  Sepsis Labs Invalid input(s): PROCALCITONIN,  WBC,  LACTICIDVEN Microbiology Recent Results (from the past 240 hour(s))  SARS Coronavirus 2 by RT PCR (hospital order, performed in [facility] hospital lab) Nasopharyngeal Nasopharyngeal Swab     Status: None   Collection Time: 07/31/21  3:45 PM   Specimen: Nasopharyngeal Swab  Result Value Ref Range Status   SARS Coronavirus 2 NEGATIVE NEGATIVE Final    Comment: (NOTE) SARS-CoV-2 target nucleic acids are NOT DETECTED.  The SARS-CoV-2 RNA is generally detectable in upper and lower respiratory specimens during the acute phase of infection. The lowest concentration of SARS-CoV-2 viral  copies this assay can detect is 250 copies / mL. A negative result does not preclude SARS-CoV-2 infection and should not be used as the sole basis for treatment or other patient management decisions.  A negative result may occur with improper specimen collection / handling, submission of specimen other than nasopharyngeal swab, presence of viral mutation(s) within the areas targeted by this assay, and inadequate number of viral copies (<250 copies / mL). A negative result must be combined with clinical observations, patient history, and epidemiological information.  Fact Sheet for Patients:   StrictlyIdeas.no  Fact Sheet for Healthcare Providers: BankingDealers.co.za  This test is not yet approved or  cleared by the [place] FDA and has been authorized for detection and/or diagnosis of SARS-CoV-2 by FDA under an Emergency Use Authorization (EUA).  This EUA will remain in effect (meaning this test can be used) for the duration  of the COVID-19 declaration under Section 564(b)(1) of the Act, 21 U.S.C. section 360bbb-3(b)(1), unless the authorization is terminated or revoked sooner.  Performed at [facility], [place] [address], [place], [place] [address]    Inpatient Medications:   Scheduled Meds:  bimatoprost  1 application Both Eyes QHS   enoxaparin (LOVENOX) injection  40 mg Subcutaneous Q24H   levothyroxine  112 mcg Oral QAC breakfast   tamsulosin  0.4 mg Oral q morning   venlafaxine XR  150 mg Oral BID   Continuous Infusions:  0.9 % NaCl with KCl 40 mEq / L     magnesium sulfate bolus IVPB     meropenem (MERREM) IV     meropenem (MERREM) IV 1 g (08/01/21 0454)   Radiological Exams on Admission: DG C-Arm 1-60 Min-No Report  Result Date: 07/31/2021 Fluoroscopy was utilized by the requesting physician.  No radiographic interpretation.    Impression/Recommendations Principal Problem:    Pyelonephritis Continue current treatment. Postop care per urology.  Active Problems:   Hyponatremia Multifactorial. Illness, HCTZ, high fluid intake. Hold hydrochlorothiazide. I asked the patient to fluid restrict for the moment. If sodium corrects may resume 50% of the current dose.    Hypertension Hold hydrochlorothiazide. Offered initiating ACE inhibitor. Inclined to resume at a lower dose after discharge. Follow-up labs closely with primary care provider.    Hypokalemia Replacing. Magnesium was supplemented. Potassium and citrate supplementation.    Depression Continue Effexor 150 mg p.o. daily.    Hypothyroid Continue Synthroid 112 mcg p.o. daily.    Residual stage of angle-closure glaucoma of right eye Continue bimatoprost drops.    Hypoalbuminemia In the setting of acute infection. Protein supplementation.   Thank you for this consultation.  Our [facility] hospitalist team will follow the patient with you.  Time Spent: This about 55 minutes were spent during the process of this consultation.  Reubin Milan M.D. [facility] 08/01/2021, 8:38 AM  This document was prepared using Dragon voice recognition software and may contain some unintended transcription errors.

## 2021-08-01 NOTE — Progress Notes (Signed)
Patient refuses SCDs. 

## 2021-08-02 DIAGNOSIS — N3030 Trigonitis without hematuria: Secondary | ICD-10-CM | POA: Diagnosis present

## 2021-08-02 DIAGNOSIS — N201 Calculus of ureter: Secondary | ICD-10-CM | POA: Diagnosis present

## 2021-08-02 LAB — BASIC METABOLIC PANEL
Anion gap: 6 (ref 5–15)
BUN: 16 mg/dL (ref 6–20)
CO2: 24 mmol/L (ref 22–32)
Calcium: 7.9 mg/dL — ABNORMAL LOW (ref 8.9–10.3)
Chloride: 105 mmol/L (ref 98–111)
Creatinine, Ser: 0.54 mg/dL (ref 0.44–1.00)
GFR, Estimated: >60 mL/min (ref >60)
Glucose, Bld: 127 mg/dL — ABNORMAL HIGH (ref 70–99)
Potassium: 4 mmol/L (ref 3.5–5.1)
Sodium: 135 mmol/L (ref 135–145)

## 2021-08-02 MED ORDER — CEFDINIR 300 MG PO CAPS: 300.0000 mg | ORAL_CAPSULE | Freq: Two times a day (BID) | ORAL | 0 refills | Status: DC

## 2021-08-02 MED ORDER — BISACODYL 5 MG PO TBEC
5.0000 mg | DELAYED_RELEASE_TABLET | Freq: Every day | ORAL | Status: DC | PRN
  Administered 2021-08-02: 5 mg via ORAL
  Filled 2021-08-02: qty 1

## 2021-08-02 NOTE — Plan of Care (Signed)

## 2021-08-02 NOTE — Progress Notes (Signed)
RN reviewed discharge paperwork with patient who stated understanding.  RN removed IV. All patient VS stable. RN transported patient via wheelchair to main entrance where ride was waiting.  Patient safely entered vehicle.

## 2021-08-02 NOTE — Discharge Summary (Addendum)
Physician Discharge Summary  Patient ID: Jacqueline Finley MRN: [id number] DOB/AGE: [birth year] 55 y.o.  Admit date: 07/31/2021 Discharge date: 08/03/2021  Admission Diagnoses:  Sepsis due to Klebsiella pneumoniae with no resultant organ failure Jacqueline Finley)  Discharge Diagnoses:  Principal Problem:   Sepsis due to Klebsiella pneumoniae with no resultant organ failure (Jacqueline Finley) Active Problems:   Depression   Hypothyroid   Residual stage of angle-closure glaucoma of right eye   Hypertension   Pyelonephritis   Hypokalemia   Hyponatremia   Hypoalbuminemia   Left ureteral stone   Follicular cystitis   Past Medical History:  Diagnosis Date   Acute angle-closure glaucoma 2015   from scopalamine patch   Depression    Hypothyroidism    PONV (postoperative nausea and vomiting)     Surgeries: Procedure(s): CYSTOSCOPY WITH RETROGRADE PYELOGRAM, URETEROSCOPY AND STENT PLACEMENT on 07/31/2021   Consultants (if any): Treatment Team:  Reubin Milan, MD  Discharged Condition: Improved  Finley Course: Jacqueline Finley is an 55 y.o. female who was admitted 07/31/2021 with a diagnosis of Sepsis due to Klebsiella pneumoniae with no resultant organ failure ([name]) and a left distal ureteral stone went to the operating room on 07/31/2021 and underwent the above named procedures.  She was found to have passed the stone which may have actually been inspissated pus, but a stent was placed because of ureteral edema and further purulent urine.  She was found to have chronic follicular cystitis.   She was initially febrile with a mild AKI, leukocytosis and tachycardia consistent with early sepsis.  She was subsequently noted to be hyponatremic and hypokalemic and the hospitalists were consulted but those have both resolved.  She is afebrile today.   Her culture grew klebsiella and resulted after discharge but she was given Cefdinir which should be effective.  The sepsis signs and symptoms had resolved at discharge.    She will need to have suppressive antibiotic for 3 months after completing the therapeutic course.   She was given perioperative antibiotics:  Anti-infectives (From admission, onward)    Start     Dose/Rate Route Frequency Ordered Stop   08/02/21 0000  cefdinir (OMNICEF) 300 MG capsule        300 mg Oral 2 times daily 08/02/21 0637     07/31/21 2200  meropenem (MERREM) 1 g in sodium chloride 0.9 % 100 mL IVPB  Status:  Discontinued        1 g 200 mL/hr over 30 Minutes Intravenous Every 8 hours 07/31/21 1838 08/02/21 1455   07/31/21 1830  meropenem (MERREM) 1 g in sodium chloride 0.9 % 100 mL IVPB  Status:  Discontinued        1 g 200 mL/hr over 30 Minutes Intravenous  Once 07/31/21 1821 08/02/21 1455   07/31/21 1654  cefTRIAXone (ROCEPHIN) 2 g in sodium chloride 0.9 % 100 mL IVPB        2 g 200 mL/hr over 30 Minutes Intravenous 30 min pre-op 07/31/21 1654 07/31/21 1718   07/31/21 1634  sodium chloride 0.9 % with cefTRIAXone (ROCEPHIN) ADS Med       Note to Pharmacy: Wynona Luna   : cabinet override      07/31/21 1634 08/01/21 0444     .  She was given sequential compression devices for DVT prophylaxis.  She benefited maximally from the Finley stay and there were no complications.    Recent vital signs:  Vitals:   08/01/21 2048 08/02/21 0604  BP: 126/88 130/90  Pulse: 73 73  Resp: 16 16  Temp: 98 F (36.7 C) 97.8 F (36.6 C)  SpO2: 98% 97%    Recent laboratory studies:  Lab Results  Component Value Date   HGB 12.6 08/01/2021   HGB 12.6 07/31/2021   HGB 13.8 06/30/2021   Lab Results  Component Value Date   WBC 10.3 08/01/2021   PLT 243 08/01/2021   No results found for: INR Lab Results  Component Value Date   NA 135 08/02/2021   K 4.0 08/02/2021   CL 105 08/02/2021   CO2 24 08/02/2021   BUN 16 08/02/2021   CREATININE 0.54 08/02/2021   GLUCOSE 127 (H) 08/02/2021    Discharge Medications:   Allergies as of 08/02/2021       Reactions   Morphine  Nausea Only   Scopolamine Other (See Comments)   Accute angle closure glaucoma        Medication List     STOP taking these medications    ketorolac 10 MG tablet Commonly known as: TORADOL       TAKE these medications    bimatoprost 0.03 % ophthalmic solution Commonly known as: LATISSE Place 1 application into both eyes See admin instructions. Apply topically along upper eyelid margin at base of eyelashes daily at bedtime   cefdinir 300 MG capsule Commonly known as: OMNICEF Take 1 capsule (300 mg total) by mouth 2 (two) times daily.   fluticasone 50 MCG/ACT nasal spray Commonly known as: FLONASE Place 1 spray into both nostrils daily as needed for allergies or rhinitis.   hydrochlorothiazide 25 MG tablet Commonly known as: HYDRODIURIL TAKE 1 TABLET BY MOUTH EVERY DAY IN THE MORNING What changed:  how much to take how to take this when to take this additional instructions   ibuprofen 200 MG tablet Commonly known as: ADVIL Take 200 mg by mouth every 6 (six) hours as needed for headache (pain).   ondansetron 4 MG tablet Commonly known as: ZOFRAN Take 4 mg by mouth 3 (three) times daily as needed for nausea or vomiting.   oxyCODONE-acetaminophen 5-325 MG tablet Commonly known as: PERCOCET/ROXICET Take 0.5 tablets by mouth every 4 (four) hours as needed (pain).   Synthroid 112 MCG tablet Generic drug: levothyroxine TAKE 1 TABLET BY MOUTH EVERY MORNING ON AN EMPTY STOMACH WITH A GLASS OF WATER AT LEAST 30 TO 60 MINUTES BEFORE BREAKFAST What changed:  how much to take how to take this when to take this additional instructions   tamsulosin 0.4 MG Caps capsule Commonly known as: FLOMAX Take 0.4 mg by mouth every morning.   venlafaxine XR 150 MG 24 hr capsule Commonly known as: EFFEXOR-XR Take 1 capsule (150 mg total) by mouth 2 (two) times daily. What changed:  how much to take when to take this        Diagnostic Studies: DG C-Arm 1-60 Min-No  Report  Result Date: 07/31/2021 Fluoroscopy was utilized by the requesting physician.  No radiographic interpretation.    Disposition: Discharge disposition: 01-Home or Self Care      Discharge Instructions     Discontinue IV   Complete by: As directed         Follow-up Information     [facility] Follow up on 08/10/2021.   Why: Keep your previously scheduled appointment. Contact information: [place] [phone]                 Signed: Jenny Reichmann  Eilyn Finley 08/03/2021, 7:21 PM

## 2021-08-03 DIAGNOSIS — A414 Sepsis due to anaerobes: Secondary | ICD-10-CM | POA: Diagnosis present

## 2021-08-05 LAB — CULTURE, BLOOD (SINGLE)
Culture: NO GROWTH
Culture: NO GROWTH
Special Requests: ADEQUATE
Special Requests: ADEQUATE

## 2021-08-08 ENCOUNTER — Other Ambulatory Visit: Payer: Self-pay | Admitting: Urology

## 2021-08-11 ENCOUNTER — Other Ambulatory Visit (HOSPITAL_COMMUNITY): Payer: Self-pay | Admitting: Adult Health

## 2021-08-11 ENCOUNTER — Other Ambulatory Visit: Payer: Self-pay | Admitting: Adult Health

## 2021-08-11 ENCOUNTER — Inpatient Hospital Stay (HOSPITAL_COMMUNITY)
Admission: EM | Admit: 2021-08-11 | Discharge: 2021-08-15 | DRG: 871 | Disposition: A | Discharge status: Home / Self Care | Payer: 59 | Source: Ambulatory Visit | Attending: Internal Medicine | Admitting: Internal Medicine

## 2021-08-11 ENCOUNTER — Other Ambulatory Visit: Payer: Self-pay

## 2021-08-11 ENCOUNTER — Encounter (HOSPITAL_COMMUNITY): Payer: Self-pay | Admitting: Emergency Medicine

## 2021-08-11 ENCOUNTER — Inpatient Hospital Stay (HOSPITAL_BASED_OUTPATIENT_CLINIC_OR_DEPARTMENT_OTHER)
Admission: RE | Admit: 2021-08-11 | Discharge: 2021-08-11 | Disposition: A | Discharge status: Home / Self Care | Payer: 59 | Source: Ambulatory Visit | Attending: Adult Health | Admitting: Adult Health

## 2021-08-11 DIAGNOSIS — Z8042 Family history of malignant neoplasm of prostate: Secondary | ICD-10-CM

## 2021-08-11 DIAGNOSIS — R0600 Dyspnea, unspecified: Secondary | ICD-10-CM | POA: Insufficient documentation

## 2021-08-11 DIAGNOSIS — Z888 Allergy status to other drugs, medicaments and biological substances status: Secondary | ICD-10-CM

## 2021-08-11 DIAGNOSIS — N151 Renal and perinephric abscess: Secondary | ICD-10-CM | POA: Diagnosis not present

## 2021-08-11 DIAGNOSIS — A4189 Other specified sepsis: Secondary | ICD-10-CM | POA: Diagnosis not present

## 2021-08-11 DIAGNOSIS — Z20822 Contact with and (suspected) exposure to covid-19: Secondary | ICD-10-CM | POA: Diagnosis present

## 2021-08-11 DIAGNOSIS — E860 Dehydration: Secondary | ICD-10-CM | POA: Diagnosis present

## 2021-08-11 DIAGNOSIS — R0602 Shortness of breath: Secondary | ICD-10-CM

## 2021-08-11 DIAGNOSIS — R1084 Generalized abdominal pain: Secondary | ICD-10-CM

## 2021-08-11 DIAGNOSIS — Z8249 Family history of ischemic heart disease and other diseases of the circulatory system: Secondary | ICD-10-CM

## 2021-08-11 DIAGNOSIS — N201 Calculus of ureter: Secondary | ICD-10-CM | POA: Insufficient documentation

## 2021-08-11 DIAGNOSIS — B961 Klebsiella pneumoniae [K. pneumoniae] as the cause of diseases classified elsewhere: Secondary | ICD-10-CM | POA: Diagnosis present

## 2021-08-11 DIAGNOSIS — F32A Depression, unspecified: Secondary | ICD-10-CM | POA: Diagnosis present

## 2021-08-11 DIAGNOSIS — A498 Other bacterial infections of unspecified site: Secondary | ICD-10-CM | POA: Diagnosis present

## 2021-08-11 DIAGNOSIS — Z885 Allergy status to narcotic agent status: Secondary | ICD-10-CM

## 2021-08-11 DIAGNOSIS — Z79899 Other long term (current) drug therapy: Secondary | ICD-10-CM

## 2021-08-11 DIAGNOSIS — I10 Essential (primary) hypertension: Secondary | ICD-10-CM | POA: Diagnosis present

## 2021-08-11 DIAGNOSIS — Z8049 Family history of malignant neoplasm of other genital organs: Secondary | ICD-10-CM

## 2021-08-11 DIAGNOSIS — Z87442 Personal history of urinary calculi: Secondary | ICD-10-CM

## 2021-08-11 DIAGNOSIS — N3020 Other chronic cystitis without hematuria: Secondary | ICD-10-CM | POA: Diagnosis present

## 2021-08-11 DIAGNOSIS — E039 Hypothyroidism, unspecified: Secondary | ICD-10-CM | POA: Diagnosis present

## 2021-08-11 DIAGNOSIS — Z79890 Hormone replacement therapy (postmenopausal): Secondary | ICD-10-CM

## 2021-08-11 MED ORDER — IOHEXOL 350 MG/ML SOLN
100.0000 mL | Freq: Once | INTRAVENOUS | Status: AC | PRN
  Administered 2021-08-11: 100 mL via INTRAVENOUS

## 2021-08-11 NOTE — ED Provider Notes (Signed)
[facility] DEPT Provider Note   CSN: [id number] Arrival date & time: 08/11/21  2310     History Chief Complaint  Patient presents with   Flank Pain   Fever    Jacqueline Finley is a 55 y.o. female.  HPI     This is a 55 year old female with a history of hypothyroidism, glaucoma, recent history of infected kidney stone and sepsis due to Klebsiella pneumonia who presents with abnormal CT scan.  Patient reports that since discharge from the hospital on 11/9, she has had persistent low-grade fevers 99-100.  She reports chills.  She also reports intermittent left flank pain.  Patient was admitted to the hospital on 11/7 and had a stent placed for her stone and was on IV antibiotics.  She did grow out Klebsiella pneumonia.  Plan was to discharge on cefdinir and be on suppressive antibiotics for 3 months.  Patient reports that she finished her course of cefdinir on Tuesday.  She followed up with urology on Thursday.  She has since taken 1 dose of Bactrim.  Given her persistent low-grade fevers and pain, repeat CT scan was obtained earlier today as an outpatient.  She was called to come back to the emergency room.  I have reviewed that CT scan.  She has what appears to be a focal renal abscess and what appears to be consistent with pyelonephritis.  She took as milligrams of Tylenol prior to arrival.  Currently her pain is controlled.  She reports her pain is 0 out of 10.  She has not had any nausea or vomiting.  Past Medical History:  Diagnosis Date   Acute angle-closure glaucoma 2015   from scopalamine patch   Depression    Hypothyroidism    PONV (postoperative nausea and vomiting)     Patient Active Problem List   Diagnosis Date Noted   Renal abscess, left 08/12/2021   Klebsiella pneumoniae infection 08/12/2021   Sepsis due to Klebsiella pneumoniae with no resultant organ failure ([place]) 08/03/2021   Left ureteral stone 24/40/1027   Follicular cystitis  25/36/6440   Hypokalemia 08/01/2021   Hyponatremia 08/01/2021   Gastroesophageal reflux disease 08/01/2021   Hypoalbuminemia 08/01/2021   Pyelonephritis 07/31/2021   Hypertension 06/30/2021   Closed fracture of proximal phalanx of digit of left hand 06/12/2021   S/P parathyroidectomy ([place]) 01/20/2015   Primary hyperparathyroidism ([place]) 11/04/2014   Nuclear cataract of right eye 11/03/2014   Depression 10/20/2014   Hypothyroid 10/20/2014   Residual stage of angle-closure glaucoma of right eye 11/15/2013    Past Surgical History:  Procedure Laterality Date   CESAREAN SECTION     CYSTOSCOPY WITH RETROGRADE PYELOGRAM, URETEROSCOPY AND STENT PLACEMENT Left 07/31/2021   Procedure: CYSTOSCOPY WITH RETROGRADE PYELOGRAM, URETEROSCOPY AND STENT PLACEMENT;  Surgeon: [name], MD;  Location: [facility];  Service: Urology;  Laterality: Left;   INCISION AND DRAINAGE     due to C-section   ORIF FINGER FRACTURE Right    4th digit   PARATHYROIDECTOMY       OB History   No obstetric history on file.     Family History  Problem Relation Age of Onset   Uterine cancer Mother 79   Heart attack Father    Hypertension Father    Prostate cancer Father    Colon cancer Neg Hx    Breast cancer Neg Hx     Social History   Tobacco Use   Smoking status: Never   Smokeless tobacco: Never  Vaping Use   Vaping Use: Never used  Substance Use Topics   Alcohol use: Yes    Comment: occ   Drug use: No    Home Medications Prior to Admission medications   Medication Sig Start Date End Date Taking? Authorizing Provider  bimatoprost (LATISSE) 0.03 % ophthalmic solution Place 1 application into both eyes See admin instructions. Apply topically along upper eyelid margin at base of eyelashes daily at bedtime 06/09/21  Yes [provider]  hydrochlorothiazide (HYDRODIURIL) 25 MG tablet TAKE 1 TABLET BY MOUTH EVERY DAY IN THE MORNING Patient taking differently: Take 25 mg by mouth every morning.  06/30/21  Yes Worley, Samantha, PA  SYNTHROID 112 MCG tablet TAKE 1 TABLET BY MOUTH EVERY MORNING ON AN EMPTY STOMACH WITH A GLASS OF WATER AT LEAST 30 TO 60 MINUTES BEFORE BREAKFAST Patient taking differently: Take 112 mcg by mouth daily before breakfast. 06/30/21  Yes Inda Coke, PA  venlafaxine XR (EFFEXOR-XR) 150 MG 24 hr capsule Take 1 capsule (150 mg total) by mouth 2 (two) times daily. Patient taking differently: Take 300 mg by mouth every morning. 06/30/21 09/28/21 Yes Inda Coke, PA    Allergies    Morphine and Scopolamine  Review of Systems   Review of Systems  Constitutional:  Positive for chills and fever.  Respiratory:  Negative for shortness of breath.   Cardiovascular:  Negative for chest pain.  Gastrointestinal:  Negative for abdominal pain, nausea and vomiting.  Genitourinary:  Positive for flank pain.  Neurological:  Negative for weakness and numbness.  All other systems reviewed and are negative.  Physical Exam Updated Vital Signs BP 113/71   Pulse 74   Temp 97.9 F (36.6 C) (Oral)   Resp 17   SpO2 94%   Physical Exam Vitals and nursing note reviewed.  Constitutional:      Appearance: She is well-developed. She is not ill-appearing.  HENT:     Head: Normocephalic and atraumatic.     Nose: Nose normal.     Mouth/Throat:     Mouth: Mucous membranes are moist.  Eyes:     Pupils: Pupils are equal, round, and reactive to light.  Cardiovascular:     Rate and Rhythm: Normal rate and regular rhythm.     Heart sounds: Normal heart sounds.  Pulmonary:     Effort: Pulmonary effort is normal. No respiratory distress.     Breath sounds: No wheezing.  Abdominal:     Palpations: Abdomen is soft.     Tenderness: There is no abdominal tenderness. There is left CVA tenderness. There is no right CVA tenderness.  Musculoskeletal:        General: No deformity.     Cervical back: Neck supple.  Skin:    General: Skin is warm and dry.  Neurological:     Mental  Status: She is alert and oriented to person, place, and time.  Psychiatric:        Mood and Affect: Mood normal.    ED Results / Procedures / Treatments   Labs (all labs ordered are listed, but only abnormal results are displayed) Labs Reviewed  CBC WITH DIFFERENTIAL/PLATELET - Abnormal; Notable for the following components:      Result Value   WBC 10.9 (*)    Platelets 694 (*)    Neutro Abs 7.8 (*)    Abs Immature Granulocytes 0.09 (*)    All other components within normal limits  COMPREHENSIVE METABOLIC PANEL - Abnormal; Notable for the following  components:   Sodium 134 (*)    Glucose, Bld 100 (*)    Calcium 8.7 (*)    Albumin 3.3 (*)    ALT 85 (*)    All other components within normal limits  URINALYSIS, ROUTINE W REFLEX MICROSCOPIC - Abnormal; Notable for the following components:   Color, Urine STRAW (*)    Specific Gravity, Urine 1.031 (*)    Hgb urine dipstick MODERATE (*)    All other components within normal limits  CBC - Abnormal; Notable for the following components:   WBC 10.7 (*)    Hemoglobin 11.3 (*)    HCT 35.0 (*)    Platelets 646 (*)    All other components within normal limits  BASIC METABOLIC PANEL - Abnormal; Notable for the following components:   Glucose, Bld 102 (*)    Calcium 8.5 (*)    All other components within normal limits  RESP PANEL BY RT-PCR (FLU A&B, COVID) ARPGX2  CULTURE, BLOOD (ROUTINE X 2)  CULTURE, BLOOD (ROUTINE X 2)  URINE CULTURE  LACTIC ACID, PLASMA    EKG None  Radiology CT ABDOMEN PELVIS W WO CONTRAST  Result Date: 08/11/2021 CLINICAL DATA:  History of known kidney infection EXAM: CT ANGIOGRAPHY CHEST CT ABDOMEN AND PELVIS WITH CONTRAST TECHNIQUE: Multidetector CT imaging of the chest was performed using the standard protocol during bolus administration of intravenous contrast. Multiplanar CT image reconstructions and MIPs were obtained to evaluate the vascular anatomy. Multidetector CT imaging of the abdomen and pelvis  was performed using the standard protocol during bolus administration of intravenous contrast. CONTRAST:  160mL OMNIPAQUE IOHEXOL 350 MG/ML SOLN COMPARISON:  08/10/2021 noncontrast CT of the abdomen FINDINGS: CTA CHEST FINDINGS Cardiovascular: Thoracic aorta and its branches are within normal limits. No aneurysmal dilatation or dissection is noted. Heart is mildly enlarged in size. The pulmonary artery is well visualized within normal branching pattern. No focal filling defect to suggest pulmonary embolism is identified. Mediastinum/Nodes: Thoracic inlet is within normal limits. Esophagus as visualized is unremarkable. No sizable hilar or mediastinal adenopathy is noted. Lungs/Pleura: Lungs are well aerated bilaterally and demonstrate mild bibasilar atelectasis. Minimal emphysematous changes are seen. No focal infiltrate or sizable effusion is seen. No sizable parenchymal nodules are noted. Musculoskeletal: Degenerative changes of the thoracic spine are noted. No acute rib abnormality is noted. Review of the MIP images confirms the above findings. CT ABDOMEN and PELVIS FINDINGS Hepatobiliary: No focal liver abnormality is seen. No gallstones, gallbladder wall thickening, or biliary dilatation. Pancreas: Unremarkable. No pancreatic ductal dilatation or surrounding inflammatory changes. Spleen: Normal in size without focal abnormality. Adrenals/Urinary Tract: Adrenal glands are within normal limits. Kidneys demonstrate a normal enhancement pattern on the right. Left kidney demonstrates a large irregular fluid attenuation collection in the upper pole measuring 4.3 x 4.0 cm in greatest AP and transverse dimensions. It extends for approximately 3.6 cm in craniocaudad projection. Delayed images show multiple areas of decreased enhancement consistent with pyelonephritis. A small subcapsular fluid collection is noted in the lower pole medially likely of similar etiology. No obstructive changes are seen. No ureteral stones  are noted. The bladder is decompressed. Tiny nonobstructing lower pole stone is noted on the right. This is stable from the most recent exam. Stomach/Bowel: No obstructive changes in the small bowel are noted. Stomach is within normal limits. Colon shows scattered diverticular change without evidence of diverticulitis. No obstructive changes are seen. The appendix is within normal limits. Vascular/Lymphatic: Aortic atherosclerosis. No enlarged abdominal or pelvic  lymph nodes. Reproductive: Uterine fibroids are seen. Largest of these measures 3.7 cm. No adnexal mass is noted. Other: No abdominal wall hernia or abnormality. No abdominopelvic ascites. Musculoskeletal: No acute or significant osseous findings. Review of the MIP images confirms the above findings. IMPRESSION: CTA of the chest: No evidence of acute pulmonary emboli. Mild emphysematous changes without acute infiltrate. CT of the abdomen and pelvis: In the upper pole of the left kidney, there is an irregular fluid collection identified consistent with focal renal abscess. This is similar to that seen on prior exam but better delineated due to the contrast enhancement. Additionally a small subcapsular fluid collection is noted inferiorly as well as multiple areas of decreased enhancement on delayed images. These changes are consistent with pyelonephritis. Uterine fibroid disease. Diverticulosis without diverticulitis. Tiny nonobstructing right renal stone stable from the previous day. Critical Value/emergent results were called by telephone at the time of interpretation on 08/11/2021 at 10:33 pm to Dr. Alyson Ingles, Who verbally acknowledged these results. Electronically Signed   By: Inez Catalina M.D.   On: 08/11/2021 22:36   CT Angio Chest Pulmonary Embolism (PE) W or WO Contrast  Result Date: 08/11/2021 CLINICAL DATA:  History of known kidney infection EXAM: CT ANGIOGRAPHY CHEST CT ABDOMEN AND PELVIS WITH CONTRAST TECHNIQUE: Multidetector CT imaging of  the chest was performed using the standard protocol during bolus administration of intravenous contrast. Multiplanar CT image reconstructions and MIPs were obtained to evaluate the vascular anatomy. Multidetector CT imaging of the abdomen and pelvis was performed using the standard protocol during bolus administration of intravenous contrast. CONTRAST:  163mL OMNIPAQUE IOHEXOL 350 MG/ML SOLN COMPARISON:  08/10/2021 noncontrast CT of the abdomen FINDINGS: CTA CHEST FINDINGS Cardiovascular: Thoracic aorta and its branches are within normal limits. No aneurysmal dilatation or dissection is noted. Heart is mildly enlarged in size. The pulmonary artery is well visualized within normal branching pattern. No focal filling defect to suggest pulmonary embolism is identified. Mediastinum/Nodes: Thoracic inlet is within normal limits. Esophagus as visualized is unremarkable. No sizable hilar or mediastinal adenopathy is noted. Lungs/Pleura: Lungs are well aerated bilaterally and demonstrate mild bibasilar atelectasis. Minimal emphysematous changes are seen. No focal infiltrate or sizable effusion is seen. No sizable parenchymal nodules are noted. Musculoskeletal: Degenerative changes of the thoracic spine are noted. No acute rib abnormality is noted. Review of the MIP images confirms the above findings. CT ABDOMEN and PELVIS FINDINGS Hepatobiliary: No focal liver abnormality is seen. No gallstones, gallbladder wall thickening, or biliary dilatation. Pancreas: Unremarkable. No pancreatic ductal dilatation or surrounding inflammatory changes. Spleen: Normal in size without focal abnormality. Adrenals/Urinary Tract: Adrenal glands are within normal limits. Kidneys demonstrate a normal enhancement pattern on the right. Left kidney demonstrates a large irregular fluid attenuation collection in the upper pole measuring 4.3 x 4.0 cm in greatest AP and transverse dimensions. It extends for approximately 3.6 cm in craniocaudad  projection. Delayed images show multiple areas of decreased enhancement consistent with pyelonephritis. A small subcapsular fluid collection is noted in the lower pole medially likely of similar etiology. No obstructive changes are seen. No ureteral stones are noted. The bladder is decompressed. Tiny nonobstructing lower pole stone is noted on the right. This is stable from the most recent exam. Stomach/Bowel: No obstructive changes in the small bowel are noted. Stomach is within normal limits. Colon shows scattered diverticular change without evidence of diverticulitis. No obstructive changes are seen. The appendix is within normal limits. Vascular/Lymphatic: Aortic atherosclerosis. No enlarged abdominal or pelvic lymph  nodes. Reproductive: Uterine fibroids are seen. Largest of these measures 3.7 cm. No adnexal mass is noted. Other: No abdominal wall hernia or abnormality. No abdominopelvic ascites. Musculoskeletal: No acute or significant osseous findings. Review of the MIP images confirms the above findings. IMPRESSION: CTA of the chest: No evidence of acute pulmonary emboli. Mild emphysematous changes without acute infiltrate. CT of the abdomen and pelvis: In the upper pole of the left kidney, there is an irregular fluid collection identified consistent with focal renal abscess. This is similar to that seen on prior exam but better delineated due to the contrast enhancement. Additionally a small subcapsular fluid collection is noted inferiorly as well as multiple areas of decreased enhancement on delayed images. These changes are consistent with pyelonephritis. Uterine fibroid disease. Diverticulosis without diverticulitis. Tiny nonobstructing right renal stone stable from the previous day. Critical Value/emergent results were called by telephone at the time of interpretation on 08/11/2021 at 10:33 pm to Dr. Alyson Ingles, Who verbally acknowledged these results. Electronically Signed   By: Inez Catalina M.D.   On:  08/11/2021 22:36    Procedures Procedures   Medications Ordered in ED Medications  cefTRIAXone (ROCEPHIN) 2 g in sodium chloride 0.9 % 100 mL IVPB (0 g Intravenous Stopped 08/12/21 0218)  lactated ringers infusion ( Intravenous New Bag/Given 08/12/21 0223)  acetaminophen (TYLENOL) tablet 650 mg (has no administration in time range)    Or  acetaminophen (TYLENOL) suppository 650 mg (has no administration in time range)  ondansetron (ZOFRAN) tablet 4 mg (has no administration in time range)    Or  ondansetron (ZOFRAN) injection 4 mg (has no administration in time range)    ED Course  I have reviewed the triage vital signs and the nursing notes.  Pertinent labs & imaging results that were available during my care of the patient were reviewed by me and considered in my medical decision making (see chart for details).    MDM Rules/Calculators/A&P                           Patient presents with intermittent for flank pain and fevers.  Recent hospitalization for stent placement for kidney stone that was infected.  Finished a course of antibiotics.  Had an outside CT scan showed a renal abscess.  She is nontoxic.  Vital signs are reassuring.  She is afebrile.  No signs or symptoms of sepsis.  Labs obtained.  Spoke with Dr. Alyson Ingles, urology.  He has reviewed her prior culture data which was pansensitive Klebsiella with the exception of ampicillin.  Recommends a cephalosporin.  We will plan for admission to the hospitalist.  Patient n.p.o. after midnight.  Recommends IR drainage in the morning.  Patient admitted to the hospitalist.  After history, exam, and medical workup I feel the patient has been appropriately medically screened and is safe for discharge home. Pertinent diagnoses were discussed with the patient. Patient was given return precautions.  Final Clinical Impression(s) / ED Diagnoses Final diagnoses:  Renal abscess, left    Rx / DC Orders ED Discharge Orders     None         Merryl Hacker, MD 08/12/21 [id number]

## 2021-08-11 NOTE — ED Provider Notes (Signed)
Emergency Medicine Provider Triage Evaluation Note  Jacqueline Finley , a 55 y.o. female  was evaluated in triage.  Pt complains of fever, L flank pain. Sxs for 2 wks, worsened after Tuesday when abx finished. She had surgery 2 wks ago, but pain never resolved. Had a ct scan today which showed continued renal abscess, and she was told to come to the ED. Started on bactrim today, had 1 dose. Had tylenol just PTA. No urinary sxs  Review of Systems  Positive: Fever, flank pain Negative: Urinary sxs  Physical Exam  BP (!) 148/101   Pulse 94   Temp 97.9 F (36.6 C) (Oral)   Resp 16   SpO2 98%  Gen:   Awake, no distress   Resp:  Normal effort  MSK:   Moves extremities without difficulty  Other:  No ttp of the abd. No cva tenderness.   Medical Decision Making  Medically screening exam initiated at 11:28 PM.  Appropriate orders placed.  Tahira Olivarez was informed that the remainder of the evaluation will be completed by another provider, this initial triage assessment does not replace that evaluation, and the importance of remaining in the ED until their evaluation is complete.  Labs, UA, covid    Franchot Heidelberg, PA-C 08/11/21 2328    Merryl Hacker, MD 08/12/21 [phone]

## 2021-08-11 NOTE — ED Triage Notes (Signed)
Pt c/o left flank pain and fever x 2 weeks. Pt had a CT scan today which showed pyelonephritis. Finished abx course on Tuesday. Denies n/v.

## 2021-08-12 ENCOUNTER — Inpatient Hospital Stay (HOSPITAL_COMMUNITY): Payer: 59

## 2021-08-12 ENCOUNTER — Encounter (HOSPITAL_COMMUNITY): Payer: Self-pay | Admitting: Internal Medicine

## 2021-08-12 DIAGNOSIS — N151 Renal and perinephric abscess: Secondary | ICD-10-CM

## 2021-08-12 DIAGNOSIS — I10 Essential (primary) hypertension: Secondary | ICD-10-CM

## 2021-08-12 DIAGNOSIS — Z8249 Family history of ischemic heart disease and other diseases of the circulatory system: Secondary | ICD-10-CM | POA: Diagnosis not present

## 2021-08-12 DIAGNOSIS — Z20822 Contact with and (suspected) exposure to covid-19: Secondary | ICD-10-CM | POA: Diagnosis present

## 2021-08-12 DIAGNOSIS — Z8049 Family history of malignant neoplasm of other genital organs: Secondary | ICD-10-CM | POA: Diagnosis not present

## 2021-08-12 DIAGNOSIS — Z8042 Family history of malignant neoplasm of prostate: Secondary | ICD-10-CM | POA: Diagnosis not present

## 2021-08-12 DIAGNOSIS — Z79899 Other long term (current) drug therapy: Secondary | ICD-10-CM | POA: Diagnosis not present

## 2021-08-12 DIAGNOSIS — B961 Klebsiella pneumoniae [K. pneumoniae] as the cause of diseases classified elsewhere: Secondary | ICD-10-CM | POA: Diagnosis present

## 2021-08-12 DIAGNOSIS — A498 Other bacterial infections of unspecified site: Secondary | ICD-10-CM | POA: Diagnosis not present

## 2021-08-12 DIAGNOSIS — E039 Hypothyroidism, unspecified: Secondary | ICD-10-CM | POA: Diagnosis present

## 2021-08-12 DIAGNOSIS — Z888 Allergy status to other drugs, medicaments and biological substances status: Secondary | ICD-10-CM | POA: Diagnosis not present

## 2021-08-12 DIAGNOSIS — Z885 Allergy status to narcotic agent status: Secondary | ICD-10-CM | POA: Diagnosis not present

## 2021-08-12 DIAGNOSIS — E860 Dehydration: Secondary | ICD-10-CM | POA: Diagnosis present

## 2021-08-12 DIAGNOSIS — N3020 Other chronic cystitis without hematuria: Secondary | ICD-10-CM | POA: Diagnosis present

## 2021-08-12 DIAGNOSIS — Z79890 Hormone replacement therapy (postmenopausal): Secondary | ICD-10-CM | POA: Diagnosis not present

## 2021-08-12 DIAGNOSIS — Z87442 Personal history of urinary calculi: Secondary | ICD-10-CM | POA: Diagnosis not present

## 2021-08-12 DIAGNOSIS — F32A Depression, unspecified: Secondary | ICD-10-CM | POA: Diagnosis present

## 2021-08-12 DIAGNOSIS — A4189 Other specified sepsis: Secondary | ICD-10-CM | POA: Diagnosis present

## 2021-08-12 LAB — CBC
HCT: 35 % — ABNORMAL LOW (ref 36.0–46.0)
Hemoglobin: 11.3 g/dL — ABNORMAL LOW (ref 12.0–15.0)
MCH: 27.3 pg (ref 26.0–34.0)
MCHC: 32.3 g/dL (ref 30.0–36.0)
MCV: 84.5 fL (ref 80.0–100.0)
Platelets: 646 10*3/uL — ABNORMAL HIGH (ref 150–400)
RBC: 4.14 MIL/uL (ref 3.87–5.11)
RDW: 13.2 % (ref 11.5–15.5)
WBC: 10.7 10*3/uL — ABNORMAL HIGH (ref 4.0–10.5)
nRBC: 0 % (ref 0.0–0.2)

## 2021-08-12 LAB — COMPREHENSIVE METABOLIC PANEL
ALT: 85 U/L — ABNORMAL HIGH (ref 0–44)
AST: 36 U/L (ref 15–41)
Albumin: 3.3 g/dL — ABNORMAL LOW (ref 3.5–5.0)
Alkaline Phosphatase: 69 U/L (ref 38–126)
Anion gap: 9 (ref 5–15)
BUN: 12 mg/dL (ref 6–20)
CO2: 24 mmol/L (ref 22–32)
Calcium: 8.7 mg/dL — ABNORMAL LOW (ref 8.9–10.3)
Chloride: 101 mmol/L (ref 98–111)
Creatinine, Ser: 0.56 mg/dL (ref 0.44–1.00)
GFR, Estimated: >60 mL/min (ref >60)
Glucose, Bld: 100 mg/dL — ABNORMAL HIGH (ref 70–99)
Potassium: 3.8 mmol/L (ref 3.5–5.1)
Sodium: 134 mmol/L — ABNORMAL LOW (ref 135–145)
Total Bilirubin: 0.4 mg/dL (ref 0.3–1.2)
Total Protein: 8.1 g/dL (ref 6.5–8.1)

## 2021-08-12 LAB — BASIC METABOLIC PANEL
Anion gap: 9 (ref 5–15)
BUN: 11 mg/dL (ref 6–20)
CO2: 23 mmol/L (ref 22–32)
Calcium: 8.5 mg/dL — ABNORMAL LOW (ref 8.9–10.3)
Chloride: 105 mmol/L (ref 98–111)
Creatinine, Ser: 0.47 mg/dL (ref 0.44–1.00)
GFR, Estimated: >60 mL/min (ref >60)
Glucose, Bld: 102 mg/dL — ABNORMAL HIGH (ref 70–99)
Potassium: 3.7 mmol/L (ref 3.5–5.1)
Sodium: 137 mmol/L (ref 135–145)

## 2021-08-12 LAB — URINALYSIS, ROUTINE W REFLEX MICROSCOPIC
Bacteria, UA: NONE SEEN
Bilirubin Urine: NEGATIVE
Glucose, UA: NEGATIVE mg/dL
Ketones, ur: NEGATIVE mg/dL
Leukocytes,Ua: NEGATIVE
Nitrite: NEGATIVE
Protein, ur: NEGATIVE mg/dL
Specific Gravity, Urine: 1.031 — ABNORMAL HIGH (ref 1.005–1.030)
pH: 6 (ref 5.0–8.0)

## 2021-08-12 LAB — CBC WITH DIFFERENTIAL/PLATELET
Abs Immature Granulocytes: 0.09 10*3/uL — ABNORMAL HIGH (ref 0.00–0.07)
Basophils Absolute: 0.1 10*3/uL (ref 0.0–0.1)
Basophils Relative: 1 %
Eosinophils Absolute: 0.1 10*3/uL (ref 0.0–0.5)
Eosinophils Relative: 1 %
HCT: 38.3 % (ref 36.0–46.0)
Hemoglobin: 12.1 g/dL (ref 12.0–15.0)
Immature Granulocytes: 1 %
Lymphocytes Relative: 19 %
Lymphs Abs: 2.1 10*3/uL (ref 0.7–4.0)
MCH: 27.1 pg (ref 26.0–34.0)
MCHC: 31.6 g/dL (ref 30.0–36.0)
MCV: 85.9 fL (ref 80.0–100.0)
Monocytes Absolute: 0.8 10*3/uL (ref 0.1–1.0)
Monocytes Relative: 7 %
Neutro Abs: 7.8 10*3/uL — ABNORMAL HIGH (ref 1.7–7.7)
Neutrophils Relative %: 71 %
Platelets: 694 10*3/uL — ABNORMAL HIGH (ref 150–400)
RBC: 4.46 MIL/uL (ref 3.87–5.11)
RDW: 13.1 % (ref 11.5–15.5)
WBC: 10.9 10*3/uL — ABNORMAL HIGH (ref 4.0–10.5)
nRBC: 0 % (ref 0.0–0.2)

## 2021-08-12 LAB — RESP PANEL BY RT-PCR (FLU A&B, COVID) ARPGX2
Influenza A by PCR: NEGATIVE
Influenza B by PCR: NEGATIVE
SARS Coronavirus 2 by RT PCR: NEGATIVE

## 2021-08-12 LAB — LACTIC ACID, PLASMA: Lactic Acid, Venous: 1.7 mmol/L (ref 0.5–1.9)

## 2021-08-12 MED ORDER — LACTATED RINGERS IV SOLN
INTRAVENOUS | Status: DC
Start: 2021-08-12 — End: 2021-08-14

## 2021-08-12 MED ORDER — MIDAZOLAM HCL 2 MG/2ML IJ SOLN
INTRAMUSCULAR | Status: AC | PRN
  Administered 2021-08-12: 1 mg via INTRAVENOUS
  Administered 2021-08-12 (×2): .5 mg via INTRAVENOUS

## 2021-08-12 MED ORDER — KETOROLAC TROMETHAMINE 30 MG/ML IJ SOLN
30.0000 mg | Freq: Once | INTRAMUSCULAR | Status: AC
  Administered 2021-08-12: 30 mg via INTRAVENOUS
  Filled 2021-08-12: qty 1

## 2021-08-12 MED ORDER — MIDAZOLAM HCL 2 MG/2ML IJ SOLN
INTRAMUSCULAR | Status: AC
  Filled 2021-08-12: qty 4

## 2021-08-12 MED ORDER — SODIUM CHLORIDE 0.9 % IV SOLN
INTRAVENOUS | Status: AC
  Filled 2021-08-12: qty 250

## 2021-08-12 MED ORDER — VENLAFAXINE HCL ER 150 MG PO CP24
150.0000 mg | ORAL_CAPSULE | Freq: Two times a day (BID) | ORAL | Status: DC
  Administered 2021-08-12 – 2021-08-15 (×6): 150 mg via ORAL
  Filled 2021-08-12 (×3): qty 1
  Filled 2021-08-12: qty 2
  Filled 2021-08-12 (×3): qty 1

## 2021-08-12 MED ORDER — FENTANYL CITRATE (PF) 100 MCG/2ML IJ SOLN
INTRAMUSCULAR | Status: AC | PRN
  Administered 2021-08-12: 25 ug via INTRAVENOUS
  Administered 2021-08-12: 50 ug via INTRAVENOUS
  Administered 2021-08-12: 25 ug via INTRAVENOUS

## 2021-08-12 MED ORDER — ONDANSETRON HCL 4 MG/2ML IJ SOLN: 4.0000 mg | Freq: Four times a day (QID) | INTRAMUSCULAR | Status: DC | PRN

## 2021-08-12 MED ORDER — MORPHINE SULFATE (PF) 2 MG/ML IV SOLN
2.0000 mg | INTRAVENOUS | Status: DC | PRN
  Administered 2021-08-12 – 2021-08-15 (×11): 2 mg via INTRAVENOUS
  Filled 2021-08-12 (×12): qty 1

## 2021-08-12 MED ORDER — ONDANSETRON HCL 4 MG PO TABS: 4.0000 mg | ORAL_TABLET | Freq: Four times a day (QID) | ORAL | Status: DC | PRN

## 2021-08-12 MED ORDER — SODIUM CHLORIDE 0.9% FLUSH
5.0000 mL | Freq: Three times a day (TID) | INTRAVENOUS | Status: DC
  Administered 2021-08-12 – 2021-08-15 (×9): 5 mL

## 2021-08-12 MED ORDER — SODIUM CHLORIDE 0.9 % IV SOLN
2.0000 g | INTRAVENOUS | Status: DC
  Administered 2021-08-12 – 2021-08-13 (×2): 2 g via INTRAVENOUS
  Filled 2021-08-12 (×2): qty 20

## 2021-08-12 MED ORDER — LEVOTHYROXINE SODIUM 112 MCG PO TABS
112.0000 ug | ORAL_TABLET | Freq: Every day | ORAL | Status: DC
Start: 2021-08-12 — End: 2021-08-15
  Administered 2021-08-12 – 2021-08-15 (×4): 112 ug via ORAL
  Filled 2021-08-12 (×4): qty 1

## 2021-08-12 MED ORDER — ACETAMINOPHEN 325 MG PO TABS
650.0000 mg | ORAL_TABLET | Freq: Four times a day (QID) | ORAL | Status: DC | PRN
  Administered 2021-08-12 – 2021-08-13 (×4): 650 mg via ORAL
  Filled 2021-08-12 (×4): qty 2

## 2021-08-12 MED ORDER — ACETAMINOPHEN 650 MG RE SUPP: 650.0000 mg | Freq: Four times a day (QID) | RECTAL | Status: DC | PRN

## 2021-08-12 MED ORDER — FENTANYL CITRATE (PF) 100 MCG/2ML IJ SOLN
INTRAMUSCULAR | Status: AC
  Filled 2021-08-12: qty 4

## 2021-08-12 NOTE — Consult Note (Signed)
Chief Complaint: Left renal abscess. Request is for left renal abscess drain placement  Referring Physician(s): Dr. Sheran Luz  Supervising Physician: Corrie Mckusick  Patient Status: [facility] - ED  History of Present Illness: Jacqueline Finley is a 55 y.o. female  55 y.o. female inpatient. (In ED). History of HTN, recent admission for left ureteral stone s/p stent placement. Cultures from urology grew K. Pneumo. Presented o the ED at [facility] with recurrent fevers, and intermittent left flank pain. Found to have a left renal abscess. CT abs pelvis from  11.18.22 reads : In the upper pole of the left kidney, there is an irregular fluid collection identified consistent with focal renal abscess. This is similar to that seen on prior exam but better delineated due to the contrast enhancement. Of note there is no stent present on CT scan.  Team is requesting left renal abscess drain placement for infection control.   Currently without any significant complaints. Endorses a headache that she states is related to dehydration and left sided flank pain that is improved with tylenol.  Patient alert sitting up  in bed, calm and comfortable. Denies any fevers, headache, chest pain, SOB, cough, nausea, vomiting or bleeding. Return precautions and treatment recommendations and follow-up discussed with the patient  who is agreeable with the plan.    Past Medical History:  Diagnosis Date   Acute angle-closure glaucoma 2015   from scopalamine patch   Depression    Hypothyroidism    PONV (postoperative nausea and vomiting)     Past Surgical History:  Procedure Laterality Date   CESAREAN SECTION     CYSTOSCOPY WITH RETROGRADE PYELOGRAM, URETEROSCOPY AND STENT PLACEMENT Left 07/31/2021   Procedure: CYSTOSCOPY WITH RETROGRADE PYELOGRAM, URETEROSCOPY AND STENT PLACEMENT;  Surgeon: [name], MD;  Location: [facility];  Service: Urology;  Laterality: Left;   INCISION AND DRAINAGE     due to C-section   ORIF FINGER  FRACTURE Right    4th digit   PARATHYROIDECTOMY      Allergies: Morphine and Scopolamine  Medications: Prior to Admission medications   Medication Sig Start Date End Date Taking? Authorizing Provider  bimatoprost (LATISSE) 0.03 % ophthalmic solution Place 1 application into both eyes See admin instructions. Apply topically along upper eyelid margin at base of eyelashes daily at bedtime 06/09/21  Yes [provider]  hydrochlorothiazide (HYDRODIURIL) 25 MG tablet TAKE 1 TABLET BY MOUTH EVERY DAY IN THE MORNING Patient taking differently: Take 25 mg by mouth every morning. 06/30/21  Yes Worley, Samantha, PA  SYNTHROID 112 MCG tablet TAKE 1 TABLET BY MOUTH EVERY MORNING ON AN EMPTY STOMACH WITH A GLASS OF WATER AT LEAST 30 TO 60 MINUTES BEFORE BREAKFAST Patient taking differently: Take 112 mcg by mouth daily before breakfast. 06/30/21  Yes Inda Coke, PA  venlafaxine XR (EFFEXOR-XR) 150 MG 24 hr capsule Take 1 capsule (150 mg total) by mouth 2 (two) times daily. Patient taking differently: Take 300 mg by mouth every morning. 06/30/21 09/28/21 Yes Inda Coke, PA     Family History  Problem Relation Age of Onset   Uterine cancer Mother 76   Heart attack Father    Hypertension Father    Prostate cancer Father    Colon cancer Neg Hx    Breast cancer Neg Hx     Social History   Socioeconomic History   Marital status: Divorced    Spouse name: Not on file   Number of children: Not on file   Years of  education: Not on file   Highest education level: Not on file  Occupational History   Occupation: CLINICAL DIETITIAN    Employer: [employer]  Tobacco Use   Smoking status: Never   Smokeless tobacco: Never  Vaping Use   Vaping Use: Never used  Substance and Sexual Activity   Alcohol use: Yes    Comment: occ   Drug use: No   Sexual activity: Yes    Birth control/protection: None  Other Topics Concern   Not on file  Social History Narrative   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  All other systems are negative.  Review of Systems  Constitutional:  Negative for fatigue and fever.  HENT:  Negative for congestion.   Respiratory:  Negative for cough and shortness of breath.   Gastrointestinal:  Negative for abdominal pain, diarrhea, nausea and vomiting.  Genitourinary:  Positive for flank pain (left side improved with tylenol).  Neurological:  Positive for headaches (thought to be due to dehydration.).   Vital Signs: BP 107/71 (BP Location: Left Arm)   Pulse 78   Temp 97.9 F (36.6 C) (Oral)   Resp 15   SpO2 94%   Physical Exam Vitals and nursing note reviewed.  Constitutional:      Appearance: She is well-developed.  HENT:     Head: Normocephalic and atraumatic.  Eyes:     Conjunctiva/sclera: Conjunctivae normal.  Cardiovascular:     Rate and Rhythm: Normal rate and regular rhythm.     Heart sounds: Normal heart sounds.  Pulmonary:     Effort: Pulmonary effort is normal.     Breath sounds: Normal breath sounds.  Musculoskeletal:        General: Normal range of motion.     Cervical back: Normal range of motion.  Skin:    General: Skin is warm.  Neurological:     Mental Status: She is alert and oriented to person, place, and time.    Imaging: CT ABDOMEN PELVIS W WO CONTRAST  Result Date: 08/11/2021 CLINICAL DATA:  History of known kidney infection EXAM: CT ANGIOGRAPHY CHEST CT ABDOMEN AND PELVIS WITH CONTRAST TECHNIQUE: Multidetector CT imaging of the chest was performed using the standard protocol during bolus administration of intravenous contrast. Multiplanar CT image reconstructions  and MIPs were obtained to evaluate the vascular anatomy. Multidetector CT imaging of the abdomen and pelvis was performed using the standard protocol during bolus administration of intravenous contrast. CONTRAST:  138mL OMNIPAQUE IOHEXOL 350 MG/ML SOLN COMPARISON:  08/10/2021 noncontrast CT of the abdomen FINDINGS: CTA CHEST FINDINGS Cardiovascular: Thoracic aorta and its branches are within normal limits. No aneurysmal dilatation or dissection is noted. Heart is mildly enlarged in size. The pulmonary artery is well visualized within normal branching pattern. No focal filling defect to suggest pulmonary embolism is identified. Mediastinum/Nodes: Thoracic inlet is within normal limits. Esophagus as visualized is unremarkable. No sizable hilar or mediastinal adenopathy is noted. Lungs/Pleura: Lungs are well aerated bilaterally and demonstrate mild bibasilar atelectasis. Minimal emphysematous changes are seen. No focal infiltrate or sizable effusion is seen. No sizable parenchymal nodules are noted. Musculoskeletal:  Degenerative changes of the thoracic spine are noted. No acute rib abnormality is noted. Review of the MIP images confirms the above findings. CT ABDOMEN and PELVIS FINDINGS Hepatobiliary: No focal liver abnormality is seen. No gallstones, gallbladder wall thickening, or biliary dilatation. Pancreas: Unremarkable. No pancreatic ductal dilatation or surrounding inflammatory changes. Spleen: Normal in size without focal abnormality. Adrenals/Urinary Tract: Adrenal glands are within normal limits. Kidneys demonstrate a normal enhancement pattern on the right. Left kidney demonstrates a large irregular fluid attenuation collection in the upper pole measuring 4.3 x 4.0 cm in greatest AP and transverse dimensions. It extends for approximately 3.6 cm in craniocaudad projection. Delayed images show multiple areas of decreased enhancement consistent with pyelonephritis. A small subcapsular fluid collection is noted  in the lower pole medially likely of similar etiology. No obstructive changes are seen. No ureteral stones are noted. The bladder is decompressed. Tiny nonobstructing lower pole stone is noted on the right. This is stable from the most recent exam. Stomach/Bowel: No obstructive changes in the small bowel are noted. Stomach is within normal limits. Colon shows scattered diverticular change without evidence of diverticulitis. No obstructive changes are seen. The appendix is within normal limits. Vascular/Lymphatic: Aortic atherosclerosis. No enlarged abdominal or pelvic lymph nodes. Reproductive: Uterine fibroids are seen. Largest of these measures 3.7 cm. No adnexal mass is noted. Other: No abdominal wall hernia or abnormality. No abdominopelvic ascites. Musculoskeletal: No acute or significant osseous findings. Review of the MIP images confirms the above findings. IMPRESSION: CTA of the chest: No evidence of acute pulmonary emboli. Mild emphysematous changes without acute infiltrate. CT of the abdomen and pelvis: In the upper pole of the left kidney, there is an irregular fluid collection identified consistent with focal renal abscess. This is similar to that seen on prior exam but better delineated due to the contrast enhancement. Additionally a small subcapsular fluid collection is noted inferiorly as well as multiple areas of decreased enhancement on delayed images. These changes are consistent with pyelonephritis. Uterine fibroid disease. Diverticulosis without diverticulitis. Tiny nonobstructing right renal stone stable from the previous day. Critical Value/emergent results were called by telephone at the time of interpretation on 08/11/2021 at 10:33 pm to Dr. Alyson Ingles, Who verbally acknowledged these results. Electronically Signed   By: Inez Catalina M.D.   On: 08/11/2021 22:36   CT Angio Chest Pulmonary Embolism (PE) W or WO Contrast  Result Date: 08/11/2021 CLINICAL DATA:  History of known kidney  infection EXAM: CT ANGIOGRAPHY CHEST CT ABDOMEN AND PELVIS WITH CONTRAST TECHNIQUE: Multidetector CT imaging of the chest was performed using the standard protocol during bolus administration of intravenous contrast. Multiplanar CT image reconstructions and MIPs were obtained to evaluate the vascular anatomy. Multidetector CT imaging of the abdomen and pelvis was performed using the standard protocol during bolus administration of intravenous contrast. CONTRAST:  193mL OMNIPAQUE IOHEXOL 350 MG/ML SOLN COMPARISON:  08/10/2021 noncontrast CT of the abdomen FINDINGS: CTA CHEST FINDINGS Cardiovascular: Thoracic aorta and its branches are within normal limits. No aneurysmal dilatation or dissection is noted. Heart is mildly enlarged in size. The pulmonary artery is well visualized within normal branching pattern. No focal filling defect to suggest pulmonary embolism is identified. Mediastinum/Nodes: Thoracic inlet is within normal limits. Esophagus as visualized is unremarkable. No sizable hilar or mediastinal adenopathy is noted. Lungs/Pleura: Lungs are well aerated bilaterally and demonstrate mild bibasilar atelectasis. Minimal emphysematous changes are seen. No focal infiltrate or sizable effusion is seen. No sizable parenchymal nodules are noted. Musculoskeletal: Degenerative  changes of the thoracic spine are noted. No acute rib abnormality is noted. Review of the MIP images confirms the above findings. CT ABDOMEN and PELVIS FINDINGS Hepatobiliary: No focal liver abnormality is seen. No gallstones, gallbladder wall thickening, or biliary dilatation. Pancreas: Unremarkable. No pancreatic ductal dilatation or surrounding inflammatory changes. Spleen: Normal in size without focal abnormality. Adrenals/Urinary Tract: Adrenal glands are within normal limits. Kidneys demonstrate a normal enhancement pattern on the right. Left kidney demonstrates a large irregular fluid attenuation collection in the upper pole measuring 4.3  x 4.0 cm in greatest AP and transverse dimensions. It extends for approximately 3.6 cm in craniocaudad projection. Delayed images show multiple areas of decreased enhancement consistent with pyelonephritis. A small subcapsular fluid collection is noted in the lower pole medially likely of similar etiology. No obstructive changes are seen. No ureteral stones are noted. The bladder is decompressed. Tiny nonobstructing lower pole stone is noted on the right. This is stable from the most recent exam. Stomach/Bowel: No obstructive changes in the small bowel are noted. Stomach is within normal limits. Colon shows scattered diverticular change without evidence of diverticulitis. No obstructive changes are seen. The appendix is within normal limits. Vascular/Lymphatic: Aortic atherosclerosis. No enlarged abdominal or pelvic lymph nodes. Reproductive: Uterine fibroids are seen. Largest of these measures 3.7 cm. No adnexal mass is noted. Other: No abdominal wall hernia or abnormality. No abdominopelvic ascites. Musculoskeletal: No acute or significant osseous findings. Review of the MIP images confirms the above findings. IMPRESSION: CTA of the chest: No evidence of acute pulmonary emboli. Mild emphysematous changes without acute infiltrate. CT of the abdomen and pelvis: In the upper pole of the left kidney, there is an irregular fluid collection identified consistent with focal renal abscess. This is similar to that seen on prior exam but better delineated due to the contrast enhancement. Additionally a small subcapsular fluid collection is noted inferiorly as well as multiple areas of decreased enhancement on delayed images. These changes are consistent with pyelonephritis. Uterine fibroid disease. Diverticulosis without diverticulitis. Tiny nonobstructing right renal stone stable from the previous day. Critical Value/emergent results were called by telephone at the time of interpretation on 08/11/2021 at 10:33 pm to Dr.  Alyson Ingles, Who verbally acknowledged these results. Electronically Signed   By: Inez Catalina M.D.   On: 08/11/2021 22:36   DG C-Arm 1-60 Min-No Report  Result Date: 07/31/2021 Fluoroscopy was utilized by the requesting physician.  No radiographic interpretation.    Labs:  CBC: Recent Labs    07/31/21 1641 08/01/21 0430 08/12/21 0030 08/12/21 0225  WBC 15.9* 10.3 10.9* 10.7*  HGB 12.6 12.6 12.1 11.3*  HCT 40.6 37.5 38.3 35.0*  PLT 218 243 694* 646*    COAGS: No results for input(s): INR, APTT in the last 8760 hours.  BMP: Recent Labs    08/01/21 0430 08/02/21 0441 08/12/21 0030 08/12/21 0225  NA 129* 135 134* 137  K 3.3* 4.0 3.8 3.7  CL 95* 105 101 105  CO2 24 24 24 23   GLUCOSE 171* 127* 100* 102*  BUN 17 16 12 11   CALCIUM 8.2* 7.9* 8.7* 8.5*  CREATININE 0.68 0.54 0.56 0.47  GFRNONAA >60 >60 >60 >60    LIVER FUNCTION TESTS: Recent Labs    06/30/21 1536 07/31/21 1641 08/01/21 0430 08/12/21 0030  BILITOT 0.2 1.0 0.2* 0.4  AST 28 29 24  36  ALT 43* 20 25 85*  ALKPHOS  --  69 62 69  PROT 7.3 7.7 7.2 8.1  ALBUMIN  --  3.5 2.8* 3.3*    Assessment and Plan:  55 y.o. female inpatient. (In ED). History of HTN, recent admission for left ureteral stone s/p stent placement. Cultures from urology grew K. Pneumo. Presented o the ED at [facility] with recurrent fevers, and intermittent left flank pain. Found to have a left renal abscess. CT abs pelvis from  11.18.22 reads : In the upper pole of the left kidney, there is an irregular fluid collection identified consistent with focal renal abscess. This is similar to that seen on prior exam but better delineated due to the contrast enhancement. Of note there is no stent present on CT scan.  Team is requesting left renal abscess drain placement for infection control.  WBC is 10.7. lactic acid 1.7, BUN, Cr are WNL. All other labs and medications are within acceptable parameters. Alergies include morphine. Patient has been NPO since  midnight per ED RN.   Risks and benefits discussed with the patient including bleeding, infection, damage to adjacent structures, bowel perforation/fistula connection, and sepsis.  All of the patient's questions were answered, patient is agreeable to proceed. Consent signed and in IR control room    Thank you for this interesting consult.  I greatly enjoyed meeting Unity Luepke and look forward to participating in their care.  A copy of this report was sent to the requesting provider on this date.  Electronically Signed: Jacqualine Mau, NP 08/12/2021, 8:18 AM   I spent a total of 40 Minutes    in face to face in clinical consultation, greater than 50% of which was counseling/coordinating care for left renal abscess drain placement.

## 2021-08-12 NOTE — Progress Notes (Signed)
PROGRESS NOTE    Jacqueline Finley  YBW:[id number] DOB: [birth year] DOA: 08/11/2021 PCP: Inda Coke, PA   Chief Complain: Left flank pain, fever  Brief Narrative: Patient is a 55 year old female with history of hypertension, infected left ureteral stone was recently admitted here earlier this month and was treated with a stent placement and was discharged with oral antibiotics came back with continuous low-grade fever, left flank pain at home.  CT abdomen/pelvis done as ordered by urology showed left renal abscess and she was sent to the emergency room for admission.  IR consulted for possible drainage.  Urology following.  Currently on ceftriaxone  Assessment & Plan:   Principal Problem:   Renal abscess, left Active Problems:   Hypertension   Klebsiella pneumoniae infection   Left renal abscess:history of hypertension, infected left ureteral stone was recently admitted here earlier this month and was treated with a stent placement and was discharged with oral antibiotics came back with continuous low-grade fever, left flank pain at home.  CT abdomen/pelvis done as ordered by urology showed left renal abscess and she was sent to the emergency room for admission.  IR consulted for possible drainage.  Urology following.  Currently on ceftriaxone Continue pain management, supportive care, gentle IV fluids.  Currently she is medically stable, afebrile.  Cultures will be followed.  Hypertension: Currently blood pressure stable.  Continue monitoring.  Takes hydrochlorothiazide at home.  History of hypothyroidism: Continue Synthyroid        DVT prophylaxis:SCD Code Status: Full Family Communication: None at bedside Status is: Inpatient      Consultants: Urology, IR  Procedures:  Antimicrobials:  Anti-infectives (From admission, onward)    Start     Dose/Rate Route Frequency Ordered Stop   08/12/21 0130  cefTRIAXone (ROCEPHIN) 2 g in sodium chloride 0.9 % 100 mL IVPB         2 g 200 mL/hr over 30 Minutes Intravenous Every 24 hours 08/12/21 0118         Subjective:  Seen and examined the bedside.  Not happy because she has been kept n.p.o. and she does not know about the timing of the procedure.  She wants to move to [place] soon.  Looks overall comfortable.  Objective: Vitals:   08/12/21 0630 08/12/21 0730 08/12/21 0800 08/12/21 1120  BP: 126/68 115/67 107/71 140/84  Pulse: 72 85 78 72  Resp: 14  15 18   Temp:      TempSrc:      SpO2: 95% 96% 94% 96%   No intake or output data in the 24 hours ending 08/12/21 1157 There were no vitals filed for this visit.  Examination:  General exam: Overall comfortable, not in distress HEENT: PERRL Respiratory system:  no wheezes or crackles  Cardiovascular system: S1 & S2 heard, RRR.  Gastrointestinal system: Abdomen is nondistended, soft and nontender. Central nervous system: Alert and oriented Extremities: No edema, no clubbing ,no cyanosis Skin: No rashes, no ulcers,no icterus      Data Reviewed: I have personally reviewed following labs and imaging studies  CBC: Recent Labs  Lab 08/12/21 0030 08/12/21 0225  WBC 10.9* 10.7*  NEUTROABS 7.8*  --   HGB 12.1 11.3*  HCT 38.3 35.0*  MCV 85.9 84.5  PLT 694* 768*   Basic Metabolic Panel: Recent Labs  Lab 08/12/21 0030 08/12/21 0225  NA 134* 137  K 3.8 3.7  CL 101 105  CO2 24 23  GLUCOSE 100* 102*  BUN 12 11  CREATININE 0.56  0.47  CALCIUM 8.7* 8.5*   GFR: Estimated Creatinine Clearance: 77.7 mL/min (by C-G formula based on SCr of 0.47 mg/dL). Liver Function Tests: Recent Labs  Lab 08/12/21 0030  AST 36  ALT 85*  ALKPHOS 69  BILITOT 0.4  PROT 8.1  ALBUMIN 3.3*   No results for input(s): LIPASE, AMYLASE in the last 168 hours. No results for input(s): AMMONIA in the last 168 hours. Coagulation Profile: No results for input(s): INR, PROTIME in the last 168 hours. Cardiac Enzymes: No results for input(s): CKTOTAL, CKMB,  CKMBINDEX, TROPONINI in the last 168 hours. BNP (last 3 results) No results for input(s): PROBNP in the last 8760 hours. HbA1C: No results for input(s): HGBA1C in the last 72 hours. CBG: No results for input(s): GLUCAP in the last 168 hours. Lipid Profile: No results for input(s): CHOL, HDL, LDLCALC, TRIG, CHOLHDL, LDLDIRECT in the last 72 hours. Thyroid Function Tests: No results for input(s): TSH, T4TOTAL, FREET4, T3FREE, THYROIDAB in the last 72 hours. Anemia Panel: No results for input(s): VITAMINB12, FOLATE, FERRITIN, TIBC, IRON, RETICCTPCT in the last 72 hours. Sepsis Labs: Recent Labs  Lab 08/12/21 0030  LATICACIDVEN 1.7    Recent Results (from the past 240 hour(s))  Resp Panel by RT-PCR (Flu A&B, Covid) Nasopharyngeal Swab     Status: None   Collection Time: 08/12/21 12:00 AM   Specimen: Nasopharyngeal Swab; Nasopharyngeal(NP) swabs in vial transport medium  Result Value Ref Range Status   SARS Coronavirus 2 by RT PCR NEGATIVE NEGATIVE Final    Comment: (NOTE) SARS-CoV-2 target nucleic acids are NOT DETECTED.  The SARS-CoV-2 RNA is generally detectable in upper respiratory specimens during the acute phase of infection. The lowest concentration of SARS-CoV-2 viral copies this assay can detect is 138 copies/mL. A negative result does not preclude SARS-Cov-2 infection and should not be used as the sole basis for treatment or other patient management decisions. A negative result may occur with  improper specimen collection/handling, submission of specimen other than nasopharyngeal swab, presence of viral mutation(s) within the areas targeted by this assay, and inadequate number of viral copies(<138 copies/mL). A negative result must be combined with clinical observations, patient history, and epidemiological information. The expected result is Negative.  Fact Sheet for Patients:  EntrepreneurPulse.com.au  Fact Sheet for Healthcare Providers:   IncredibleEmployment.be  This test is no t yet approved or cleared by the [place] FDA and  has been authorized for detection and/or diagnosis of SARS-CoV-2 by FDA under an Emergency Use Authorization (EUA). This EUA will remain  in effect (meaning this test can be used) for the duration of the COVID-19 declaration under Section 564(b)(1) of the Act, 21 U.S.C.section 360bbb-3(b)(1), unless the authorization is terminated  or revoked sooner.       Influenza A by PCR NEGATIVE NEGATIVE Final   Influenza B by PCR NEGATIVE NEGATIVE Final    Comment: (NOTE) The Xpert Xpress SARS-CoV-2/FLU/RSV plus assay is intended as an aid in the diagnosis of influenza from Nasopharyngeal swab specimens and should not be used as a sole basis for treatment. Nasal washings and aspirates are unacceptable for Xpert Xpress SARS-CoV-2/FLU/RSV testing.  Fact Sheet for Patients: EntrepreneurPulse.com.au  Fact Sheet for Healthcare Providers: IncredibleEmployment.be  This test is not yet approved or cleared by the [place] FDA and has been authorized for detection and/or diagnosis of SARS-CoV-2 by FDA under an Emergency Use Authorization (EUA). This EUA will remain in effect (meaning this test can be used) for the duration of the  COVID-19 declaration under Section 564(b)(1) of the Act, 21 U.S.C. section 360bbb-3(b)(1), unless the authorization is terminated or revoked.  Performed at [facility], [place] [address] [place], [place] [address]          Radiology Studies: CT ABDOMEN PELVIS W WO CONTRAST  Result Date: 08/11/2021 CLINICAL DATA:  History of known kidney infection EXAM: CT ANGIOGRAPHY CHEST CT ABDOMEN AND PELVIS WITH CONTRAST TECHNIQUE: Multidetector CT imaging of the chest was performed using the standard protocol during bolus administration of intravenous contrast. Multiplanar CT image reconstructions and  MIPs were obtained to evaluate the vascular anatomy. Multidetector CT imaging of the abdomen and pelvis was performed using the standard protocol during bolus administration of intravenous contrast. CONTRAST:  170mL OMNIPAQUE IOHEXOL 350 MG/ML SOLN COMPARISON:  08/10/2021 noncontrast CT of the abdomen FINDINGS: CTA CHEST FINDINGS Cardiovascular: Thoracic aorta and its branches are within normal limits. No aneurysmal dilatation or dissection is noted. Heart is mildly enlarged in size. The pulmonary artery is well visualized within normal branching pattern. No focal filling defect to suggest pulmonary embolism is identified. Mediastinum/Nodes: Thoracic inlet is within normal limits. Esophagus as visualized is unremarkable. No sizable hilar or mediastinal adenopathy is noted. Lungs/Pleura: Lungs are well aerated bilaterally and demonstrate mild bibasilar atelectasis. Minimal emphysematous changes are seen. No focal infiltrate or sizable effusion is seen. No sizable parenchymal nodules are noted. Musculoskeletal: Degenerative changes of the thoracic spine are noted. No acute rib abnormality is noted. Review of the MIP images confirms the above findings. CT ABDOMEN and PELVIS FINDINGS Hepatobiliary: No focal liver abnormality is seen. No gallstones, gallbladder wall thickening, or biliary dilatation. Pancreas: Unremarkable. No pancreatic ductal dilatation or surrounding inflammatory changes. Spleen: Normal in size without focal abnormality. Adrenals/Urinary Tract: Adrenal glands are within normal limits. Kidneys demonstrate a normal enhancement pattern on the right. Left kidney demonstrates a large irregular fluid attenuation collection in the upper pole measuring 4.3 x 4.0 cm in greatest AP and transverse dimensions. It extends for approximately 3.6 cm in craniocaudad projection. Delayed images show multiple areas of decreased enhancement consistent with pyelonephritis. A small subcapsular fluid collection is noted in  the lower pole medially likely of similar etiology. No obstructive changes are seen. No ureteral stones are noted. The bladder is decompressed. Tiny nonobstructing lower pole stone is noted on the right. This is stable from the most recent exam. Stomach/Bowel: No obstructive changes in the small bowel are noted. Stomach is within normal limits. Colon shows scattered diverticular change without evidence of diverticulitis. No obstructive changes are seen. The appendix is within normal limits. Vascular/Lymphatic: Aortic atherosclerosis. No enlarged abdominal or pelvic lymph nodes. Reproductive: Uterine fibroids are seen. Largest of these measures 3.7 cm. No adnexal mass is noted. Other: No abdominal wall hernia or abnormality. No abdominopelvic ascites. Musculoskeletal: No acute or significant osseous findings. Review of the MIP images confirms the above findings. IMPRESSION: CTA of the chest: No evidence of acute pulmonary emboli. Mild emphysematous changes without acute infiltrate. CT of the abdomen and pelvis: In the upper pole of the left kidney, there is an irregular fluid collection identified consistent with focal renal abscess. This is similar to that seen on prior exam but better delineated due to the contrast enhancement. Additionally a small subcapsular fluid collection is noted inferiorly as well as multiple areas of decreased enhancement on delayed images. These changes are consistent with pyelonephritis. Uterine fibroid disease. Diverticulosis without diverticulitis. Tiny nonobstructing right renal stone stable from the previous day. Critical Value/emergent  results were called by telephone at the time of interpretation on 08/11/2021 at 10:33 pm to Dr. Alyson Ingles, Who verbally acknowledged these results. Electronically Signed   By: Inez Catalina M.D.   On: 08/11/2021 22:36   CT Angio Chest Pulmonary Embolism (PE) W or WO Contrast  Result Date: 08/11/2021 CLINICAL DATA:  History of known kidney infection  EXAM: CT ANGIOGRAPHY CHEST CT ABDOMEN AND PELVIS WITH CONTRAST TECHNIQUE: Multidetector CT imaging of the chest was performed using the standard protocol during bolus administration of intravenous contrast. Multiplanar CT image reconstructions and MIPs were obtained to evaluate the vascular anatomy. Multidetector CT imaging of the abdomen and pelvis was performed using the standard protocol during bolus administration of intravenous contrast. CONTRAST:  173mL OMNIPAQUE IOHEXOL 350 MG/ML SOLN COMPARISON:  08/10/2021 noncontrast CT of the abdomen FINDINGS: CTA CHEST FINDINGS Cardiovascular: Thoracic aorta and its branches are within normal limits. No aneurysmal dilatation or dissection is noted. Heart is mildly enlarged in size. The pulmonary artery is well visualized within normal branching pattern. No focal filling defect to suggest pulmonary embolism is identified. Mediastinum/Nodes: Thoracic inlet is within normal limits. Esophagus as visualized is unremarkable. No sizable hilar or mediastinal adenopathy is noted. Lungs/Pleura: Lungs are well aerated bilaterally and demonstrate mild bibasilar atelectasis. Minimal emphysematous changes are seen. No focal infiltrate or sizable effusion is seen. No sizable parenchymal nodules are noted. Musculoskeletal: Degenerative changes of the thoracic spine are noted. No acute rib abnormality is noted. Review of the MIP images confirms the above findings. CT ABDOMEN and PELVIS FINDINGS Hepatobiliary: No focal liver abnormality is seen. No gallstones, gallbladder wall thickening, or biliary dilatation. Pancreas: Unremarkable. No pancreatic ductal dilatation or surrounding inflammatory changes. Spleen: Normal in size without focal abnormality. Adrenals/Urinary Tract: Adrenal glands are within normal limits. Kidneys demonstrate a normal enhancement pattern on the right. Left kidney demonstrates a large irregular fluid attenuation collection in the upper pole measuring 4.3 x 4.0 cm  in greatest AP and transverse dimensions. It extends for approximately 3.6 cm in craniocaudad projection. Delayed images show multiple areas of decreased enhancement consistent with pyelonephritis. A small subcapsular fluid collection is noted in the lower pole medially likely of similar etiology. No obstructive changes are seen. No ureteral stones are noted. The bladder is decompressed. Tiny nonobstructing lower pole stone is noted on the right. This is stable from the most recent exam. Stomach/Bowel: No obstructive changes in the small bowel are noted. Stomach is within normal limits. Colon shows scattered diverticular change without evidence of diverticulitis. No obstructive changes are seen. The appendix is within normal limits. Vascular/Lymphatic: Aortic atherosclerosis. No enlarged abdominal or pelvic lymph nodes. Reproductive: Uterine fibroids are seen. Largest of these measures 3.7 cm. No adnexal mass is noted. Other: No abdominal wall hernia or abnormality. No abdominopelvic ascites. Musculoskeletal: No acute or significant osseous findings. Review of the MIP images confirms the above findings. IMPRESSION: CTA of the chest: No evidence of acute pulmonary emboli. Mild emphysematous changes without acute infiltrate. CT of the abdomen and pelvis: In the upper pole of the left kidney, there is an irregular fluid collection identified consistent with focal renal abscess. This is similar to that seen on prior exam but better delineated due to the contrast enhancement. Additionally a small subcapsular fluid collection is noted inferiorly as well as multiple areas of decreased enhancement on delayed images. These changes are consistent with pyelonephritis. Uterine fibroid disease. Diverticulosis without diverticulitis. Tiny nonobstructing right renal stone stable from the previous day. Critical Value/emergent results  were called by telephone at the time of interpretation on 08/11/2021 at 10:33 pm to Dr. Alyson Ingles,  Who verbally acknowledged these results. Electronically Signed   By: Inez Catalina M.D.   On: 08/11/2021 22:36        Scheduled Meds:  levothyroxine  112 mcg Oral QAC breakfast   venlafaxine XR  150 mg Oral BID   Continuous Infusions:  cefTRIAXone (ROCEPHIN)  IV Stopped (08/12/21 0218)   lactated ringers 100 mL/hr at 08/12/21 0223     LOS: 0 days    Time spent: More than 50% of that time was spent in counseling and/or coordination of care.      Shelly Coss, MD [facility] P11/19/2022, 11:57 AM

## 2021-08-12 NOTE — H&P (Addendum)
History and Physical    Jacqueline Finley WVP:[id number] DOB: 10-07-1965 DOA: 08/11/2021  PCP: Inda Coke, PA  Patient coming from: Home  I have personally briefly reviewed patient's old medical records in [place]  Chief Complaint: L flank pain, fever  HPI: Jacqueline Finley is a 55 y.o. female with medical history significant of HTN.  Pt had infected L ureteral stone, admitted 11/7-11/19, got stent placement at that time.  UA done in the office prior to admission grew out K. Pnemo which was pan-sensitive except to amino PCNs.  Pt discharged on cefdinir (which it should have been sensitive to.  Pt finished course of cefdinir on Tues.  Unfortunately since discharge, despite ABx.  She has had persistent, recurrent low grade fevers of 99-100, chills, intermittent L flank pain.  Followed up with urology on Thursday.  Due to recurrent symptoms had CT scan earlier today which showed L renal abscess.  Pt sent in to ED.   ED Course: WBC 10.9k, no other SIRS.  Hospitalist asked to admit for IR drainage of renal abscess.   Review of Systems: As per HPI, otherwise all review of systems negative.  Past Medical History:  Diagnosis Date   Acute angle-closure glaucoma 2015   from scopalamine patch   Depression    Hypothyroidism    PONV (postoperative nausea and vomiting)     Past Surgical History:  Procedure Laterality Date   CESAREAN SECTION     CYSTOSCOPY WITH RETROGRADE PYELOGRAM, URETEROSCOPY AND STENT PLACEMENT Left 07/31/2021   Procedure: CYSTOSCOPY WITH RETROGRADE PYELOGRAM, URETEROSCOPY AND STENT PLACEMENT;  Surgeon: [name], MD;  Location: [facility];  Service: Urology;  Laterality: Left;   INCISION AND DRAINAGE     due to C-section   ORIF FINGER FRACTURE Right    4th digit   PARATHYROIDECTOMY       reports that she has never smoked. She has never used smokeless tobacco. She reports current alcohol use. She reports that she does not use drugs.  Allergies   Allergen Reactions   Morphine Nausea Only   Scopolamine Other (See Comments)    Accute angle closure glaucoma    Family History  Problem Relation Age of Onset   Uterine cancer Mother 22   Heart attack Father    Hypertension Father    Prostate cancer Father    Colon cancer Neg Hx    Breast cancer Neg Hx      Prior to Admission medications   Medication Sig Start Date End Date Taking? Authorizing Provider  bimatoprost (LATISSE) 0.03 % ophthalmic solution Place 1 application into both eyes See admin instructions. Apply topically along upper eyelid margin at base of eyelashes daily at bedtime 06/09/21   [provider]  fluticasone (FLONASE) 50 MCG/ACT nasal spray Place 1 spray into both nostrils daily as needed for allergies or rhinitis. 10/17/14   [provider]  hydrochlorothiazide (HYDRODIURIL) 25 MG tablet TAKE 1 TABLET BY MOUTH EVERY DAY IN THE MORNING Patient taking differently: Take 25 mg by mouth every morning. 06/30/21   Inda Coke, PA  ibuprofen (ADVIL,MOTRIN) 200 MG tablet Take 200 mg by mouth every 6 (six) hours as needed for headache (pain).    [provider]  ondansetron (ZOFRAN) 4 MG tablet Take 4 mg by mouth 3 (three) times daily as needed for nausea or vomiting. 07/28/21   [provider]  oxyCODONE-acetaminophen (PERCOCET/ROXICET) 5-325 MG tablet Take 0.5 tablets by mouth every 4 (four) hours as needed (pain). 07/28/21  [provider]  SYNTHROID 112 MCG tablet TAKE 1 TABLET BY MOUTH EVERY MORNING ON AN EMPTY STOMACH WITH A GLASS OF WATER AT LEAST 30 TO [place] BREAKFAST Patient taking differently: Take 112 mcg by mouth daily before breakfast. 06/30/21   Inda Coke, PA  tamsulosin (FLOMAX) 0.4 MG CAPS capsule Take 0.4 mg by mouth every morning. 07/28/21   [provider]  venlafaxine XR (EFFEXOR-XR) 150 MG 24 hr capsule Take 1 capsule (150 mg total) by mouth 2 (two) times daily. Patient taking  differently: Take 300 mg by mouth every morning. 06/30/21 09/28/21  Inda Coke, PA    Physical Exam: Vitals:   08/11/21 2321 08/11/21 2323 08/11/21 2323 08/12/21 0045  BP: (!) 148/101   (!) 122/98  Pulse:  94  62  Resp:   16 15  Temp:   97.9 F (36.6 C)   TempSrc:   Oral   SpO2:  98%  96%    Constitutional: NAD, calm, comfortable Eyes: PERRL, lids and conjunctivae normal ENMT: Mucous membranes are moist. Posterior pharynx clear of any exudate or lesions.Normal dentition.  Neck: normal, supple, no masses, no thyromegaly Respiratory: clear to auscultation bilaterally, no wheezing, no crackles. Normal respiratory effort. No accessory muscle use.  Cardiovascular: Regular rate and rhythm, no murmurs / rubs / gallops. No extremity edema. 2+ pedal pulses. No carotid bruits.  Abdomen: no tenderness, no masses palpated. No hepatosplenomegaly. Bowel sounds positive.  Musculoskeletal: no clubbing / cyanosis. No joint deformity upper and lower extremities. Good ROM, no contractures. Normal muscle tone.  Skin: no rashes, lesions, ulcers. No induration Neurologic: CN 2-12 grossly intact. Sensation intact, DTR normal. Strength 5/5 in all 4.  Psychiatric: Normal judgment and insight. Alert and oriented x 3. Normal mood.    Labs on Admission: I have personally reviewed following labs and imaging studies  CBC: Recent Labs  Lab 08/12/21 0030  WBC 10.9*  NEUTROABS 7.8*  HGB 12.1  HCT 38.3  MCV 85.9  PLT 161*   Basic Metabolic Panel: Recent Labs  Lab 08/12/21 0030  NA 134*  K 3.8  CL 101  CO2 24  GLUCOSE 100*  BUN 12  CREATININE 0.56  CALCIUM 8.7*   GFR: Estimated Creatinine Clearance: 77.7 mL/min (by C-G formula based on SCr of 0.56 mg/dL). Liver Function Tests: Recent Labs  Lab 08/12/21 0030  AST 36  ALT 85*  ALKPHOS 69  BILITOT 0.4  PROT 8.1  ALBUMIN 3.3*   No results for input(s): LIPASE, AMYLASE in the last 168 hours. No results for input(s): AMMONIA in the last  168 hours. Coagulation Profile: No results for input(s): INR, PROTIME in the last 168 hours. Cardiac Enzymes: No results for input(s): CKTOTAL, CKMB, CKMBINDEX, TROPONINI in the last 168 hours. BNP (last 3 results) No results for input(s): PROBNP in the last 8760 hours. HbA1C: No results for input(s): HGBA1C in the last 72 hours. CBG: No results for input(s): GLUCAP in the last 168 hours. Lipid Profile: No results for input(s): CHOL, HDL, LDLCALC, TRIG, CHOLHDL, LDLDIRECT in the last 72 hours. Thyroid Function Tests: No results for input(s): TSH, T4TOTAL, FREET4, T3FREE, THYROIDAB in the last 72 hours. Anemia Panel: No results for input(s): VITAMINB12, FOLATE, FERRITIN, TIBC, IRON, RETICCTPCT in the last 72 hours. Urine analysis:    Component Value Date/Time   COLORURINE STRAW (A) 08/12/2021 0030   APPEARANCEUR CLEAR 08/12/2021 0030   LABSPEC 1.031 (H) 08/12/2021 0030   PHURINE 6.0 08/12/2021 0030   GLUCOSEU NEGATIVE  08/12/2021 0030   HGBUR MODERATE (A) 08/12/2021 0030   BILIRUBINUR NEGATIVE 08/12/2021 0030   KETONESUR NEGATIVE 08/12/2021 0030   PROTEINUR NEGATIVE 08/12/2021 0030   NITRITE NEGATIVE 08/12/2021 0030   LEUKOCYTESUR NEGATIVE 08/12/2021 0030    Radiological Exams on Admission: CT ABDOMEN PELVIS W WO CONTRAST  Result Date: 08/11/2021 CLINICAL DATA:  History of known kidney infection EXAM: CT ANGIOGRAPHY CHEST CT ABDOMEN AND PELVIS WITH CONTRAST TECHNIQUE: Multidetector CT imaging of the chest was performed using the standard protocol during bolus administration of intravenous contrast. Multiplanar CT image reconstructions and MIPs were obtained to evaluate the vascular anatomy. Multidetector CT imaging of the abdomen and pelvis was performed using the standard protocol during bolus administration of intravenous contrast. CONTRAST:  136mL OMNIPAQUE IOHEXOL 350 MG/ML SOLN COMPARISON:  08/10/2021 noncontrast CT of the abdomen FINDINGS: CTA CHEST FINDINGS Cardiovascular:  Thoracic aorta and its branches are within normal limits. No aneurysmal dilatation or dissection is noted. Heart is mildly enlarged in size. The pulmonary artery is well visualized within normal branching pattern. No focal filling defect to suggest pulmonary embolism is identified. Mediastinum/Nodes: Thoracic inlet is within normal limits. Esophagus as visualized is unremarkable. No sizable hilar or mediastinal adenopathy is noted. Lungs/Pleura: Lungs are well aerated bilaterally and demonstrate mild bibasilar atelectasis. Minimal emphysematous changes are seen. No focal infiltrate or sizable effusion is seen. No sizable parenchymal nodules are noted. Musculoskeletal: Degenerative changes of the thoracic spine are noted. No acute rib abnormality is noted. Review of the MIP images confirms the above findings. CT ABDOMEN and PELVIS FINDINGS Hepatobiliary: No focal liver abnormality is seen. No gallstones, gallbladder wall thickening, or biliary dilatation. Pancreas: Unremarkable. No pancreatic ductal dilatation or surrounding inflammatory changes. Spleen: Normal in size without focal abnormality. Adrenals/Urinary Tract: Adrenal glands are within normal limits. Kidneys demonstrate a normal enhancement pattern on the right. Left kidney demonstrates a large irregular fluid attenuation collection in the upper pole measuring 4.3 x 4.0 cm in greatest AP and transverse dimensions. It extends for approximately 3.6 cm in craniocaudad projection. Delayed images show multiple areas of decreased enhancement consistent with pyelonephritis. A small subcapsular fluid collection is noted in the lower pole medially likely of similar etiology. No obstructive changes are seen. No ureteral stones are noted. The bladder is decompressed. Tiny nonobstructing lower pole stone is noted on the right. This is stable from the most recent exam. Stomach/Bowel: No obstructive changes in the small bowel are noted. Stomach is within normal limits.  Colon shows scattered diverticular change without evidence of diverticulitis. No obstructive changes are seen. The appendix is within normal limits. Vascular/Lymphatic: Aortic atherosclerosis. No enlarged abdominal or pelvic lymph nodes. Reproductive: Uterine fibroids are seen. Largest of these measures 3.7 cm. No adnexal mass is noted. Other: No abdominal wall hernia or abnormality. No abdominopelvic ascites. Musculoskeletal: No acute or significant osseous findings. Review of the MIP images confirms the above findings. IMPRESSION: CTA of the chest: No evidence of acute pulmonary emboli. Mild emphysematous changes without acute infiltrate. CT of the abdomen and pelvis: In the upper pole of the left kidney, there is an irregular fluid collection identified consistent with focal renal abscess. This is similar to that seen on prior exam but better delineated due to the contrast enhancement. Additionally a small subcapsular fluid collection is noted inferiorly as well as multiple areas of decreased enhancement on delayed images. These changes are consistent with pyelonephritis. Uterine fibroid disease. Diverticulosis without diverticulitis. Tiny nonobstructing right renal stone stable from the previous day.  Critical Value/emergent results were called by telephone at the time of interpretation on 08/11/2021 at 10:33 pm to Dr. Alyson Ingles, Who verbally acknowledged these results. Electronically Signed   By: Inez Catalina M.D.   On: 08/11/2021 22:36   CT Angio Chest Pulmonary Embolism (PE) W or WO Contrast  Result Date: 08/11/2021 CLINICAL DATA:  History of known kidney infection EXAM: CT ANGIOGRAPHY CHEST CT ABDOMEN AND PELVIS WITH CONTRAST TECHNIQUE: Multidetector CT imaging of the chest was performed using the standard protocol during bolus administration of intravenous contrast. Multiplanar CT image reconstructions and MIPs were obtained to evaluate the vascular anatomy. Multidetector CT imaging of the abdomen and  pelvis was performed using the standard protocol during bolus administration of intravenous contrast. CONTRAST:  160mL OMNIPAQUE IOHEXOL 350 MG/ML SOLN COMPARISON:  08/10/2021 noncontrast CT of the abdomen FINDINGS: CTA CHEST FINDINGS Cardiovascular: Thoracic aorta and its branches are within normal limits. No aneurysmal dilatation or dissection is noted. Heart is mildly enlarged in size. The pulmonary artery is well visualized within normal branching pattern. No focal filling defect to suggest pulmonary embolism is identified. Mediastinum/Nodes: Thoracic inlet is within normal limits. Esophagus as visualized is unremarkable. No sizable hilar or mediastinal adenopathy is noted. Lungs/Pleura: Lungs are well aerated bilaterally and demonstrate mild bibasilar atelectasis. Minimal emphysematous changes are seen. No focal infiltrate or sizable effusion is seen. No sizable parenchymal nodules are noted. Musculoskeletal: Degenerative changes of the thoracic spine are noted. No acute rib abnormality is noted. Review of the MIP images confirms the above findings. CT ABDOMEN and PELVIS FINDINGS Hepatobiliary: No focal liver abnormality is seen. No gallstones, gallbladder wall thickening, or biliary dilatation. Pancreas: Unremarkable. No pancreatic ductal dilatation or surrounding inflammatory changes. Spleen: Normal in size without focal abnormality. Adrenals/Urinary Tract: Adrenal glands are within normal limits. Kidneys demonstrate a normal enhancement pattern on the right. Left kidney demonstrates a large irregular fluid attenuation collection in the upper pole measuring 4.3 x 4.0 cm in greatest AP and transverse dimensions. It extends for approximately 3.6 cm in craniocaudad projection. Delayed images show multiple areas of decreased enhancement consistent with pyelonephritis. A small subcapsular fluid collection is noted in the lower pole medially likely of similar etiology. No obstructive changes are seen. No ureteral  stones are noted. The bladder is decompressed. Tiny nonobstructing lower pole stone is noted on the right. This is stable from the most recent exam. Stomach/Bowel: No obstructive changes in the small bowel are noted. Stomach is within normal limits. Colon shows scattered diverticular change without evidence of diverticulitis. No obstructive changes are seen. The appendix is within normal limits. Vascular/Lymphatic: Aortic atherosclerosis. No enlarged abdominal or pelvic lymph nodes. Reproductive: Uterine fibroids are seen. Largest of these measures 3.7 cm. No adnexal mass is noted. Other: No abdominal wall hernia or abnormality. No abdominopelvic ascites. Musculoskeletal: No acute or significant osseous findings. Review of the MIP images confirms the above findings. IMPRESSION: CTA of the chest: No evidence of acute pulmonary emboli. Mild emphysematous changes without acute infiltrate. CT of the abdomen and pelvis: In the upper pole of the left kidney, there is an irregular fluid collection identified consistent with focal renal abscess. This is similar to that seen on prior exam but better delineated due to the contrast enhancement. Additionally a small subcapsular fluid collection is noted inferiorly as well as multiple areas of decreased enhancement on delayed images. These changes are consistent with pyelonephritis. Uterine fibroid disease. Diverticulosis without diverticulitis. Tiny nonobstructing right renal stone stable from the previous day. Critical  Value/emergent results were called by telephone at the time of interpretation on 08/11/2021 at 10:33 pm to Dr. Alyson Ingles, Who verbally acknowledged these results. Electronically Signed   By: Inez Catalina M.D.   On: 08/11/2021 22:36    EKG: Independently reviewed.  Assessment/Plan Principal Problem:   Renal abscess, left Active Problems:   Hypertension   Klebsiella pneumoniae infection    L renal abscess - Presumably due to K. Pneumo given UCx in  urology office before admission last time. K.Pneumo reportedly pan sensitive except to amino PCNs per EDP discussion with Dr. Alyson Ingles tonight Rocephin for the moment UCx, BCx pending IR eval and drainage Culture drainage fluid when available NPO IVF: LR at 100 Tylenol PRN fever Zofran PRN nausea HTN - Home med rec pending Plan to continue home meds, except diuretic  DVT prophylaxis: SCDs - delay chemo ppx for IR procedure Code Status: Full Family Communication: No family in room Disposition Plan: Home after treatment for perinephric abscess Consults called: IR order put into chart.  EDP spoke with Dr. Alyson Ingles Admission status: Admit to inpatient  Severity of Illness: The appropriate patient status for this patient is INPATIENT. Inpatient status is judged to be reasonable and necessary in order to provide the required intensity of service to ensure the patient's safety. The patient's presenting symptoms, physical exam findings, and initial radiographic and laboratory data in the context of their chronic comorbidities is felt to place them at high risk for further clinical deterioration. Furthermore, it is not anticipated that the patient will be medically stable for discharge from the hospital within 2 midnights of admission.   IP status due to perinephric abscess needing IR drainage.  Failed outpt treatment.  * I certify that at the point of admission it is my clinical judgment that the patient will require inpatient hospital care spanning beyond 2 midnights from the point of admission due to high intensity of service, high risk for further deterioration and high frequency of surveillance required.*   Vinayak Bobier M. DO [facility]  How to contact the [facility] Attending or Consulting provider [place] or covering provider during after hours [place], for this patient?  Check the care team in [facility] and look for a) attending/consulting [facility] provider listed and b) the [facility] team  listed Log into [email or web address]  Amion Physician Scheduling and messaging for groups and whole hospitals  On call and physician scheduling software for group practices, residents, hospitalists and other medical providers for call, clinic, rotation and shift schedules. On[employer] is a hospital-wide system for scheduling doctors and paging doctors on call. EasyPlot is for scientific plotting and data analysis.  [email or web address]  and use [place]'s universal password to access. If you do not have the password, please contact the hospital operator.  Locate the [facility] provider you are looking for under [facility] and page to a number that you can be directly reached. If you still have difficulty reaching the provider, please page the [facility] (Director on Call) for the Hospitalists listed on amion for assistance.  08/12/2021, 1:23 AM

## 2021-08-12 NOTE — Procedures (Signed)
Interventional Radiology Procedure Note  Procedure: Image guided drain placement, left renal abscess.  101F pigtail drain.  Complications: None  EBL: None Sample: Culture sent  Recommendations: - Routine drain care, with sterile flushes, record output - follow up Cx - routine wound care  Signed,  Dulcy Fanny. Earleen Newport, DO

## 2021-08-12 NOTE — ED Notes (Signed)
Was notified by radiology that pt's procedure is now scheduled for tomorrow, hospitalist requesting that radiologist update pt, called [place] radiology and left a message w/ staff for radiologist to update patient on POC.

## 2021-08-12 NOTE — ED Notes (Signed)
NP updated pt' plan to do the procedure today, pt aware of plan.

## 2021-08-12 NOTE — Consult Note (Signed)
Urology Consult  Referring physician: Dr. Dina Rich Reason for referral: left renal abscess  Chief Complaint: left flank pain  History of Present Illness: Jacqueline Finley is a 55yo admitted with a left renal abscess. She has a hx of nephrolithiasis 2 weeks ago and underwent left ureteroscopy and stent placement 11/8. She then developed fevers after stent removal. She was started on broad spectrum antibiotics but the fevers persisted and her left flank pain continued to worsen. She underwent CT chest/adb/pelvis last night which shows a new 5cm left renal abscess and a new left perinephric fluid collection. She currently has sharp, intermittent, mild to moderate nonraditing left flank pain. No worsening LUTS. No fevers today.   Past Medical History:  Diagnosis Date   Acute angle-closure glaucoma 2015   from scopalamine patch   Depression    Hypothyroidism    PONV (postoperative nausea and vomiting)    Past Surgical History:  Procedure Laterality Date   CESAREAN SECTION     CYSTOSCOPY WITH RETROGRADE PYELOGRAM, URETEROSCOPY AND STENT PLACEMENT Left 07/31/2021   Procedure: CYSTOSCOPY WITH RETROGRADE PYELOGRAM, URETEROSCOPY AND STENT PLACEMENT;  Surgeon: [name], MD;  Location: [facility];  Service: Urology;  Laterality: Left;   INCISION AND DRAINAGE     due to C-section   ORIF FINGER FRACTURE Right    4th digit   PARATHYROIDECTOMY      Medications: I have reviewed the patient's current medications. Allergies:  Allergies  Allergen Reactions   Morphine Nausea Only   Scopolamine Other (See Comments)    Accute angle closure glaucoma    Family History  Problem Relation Age of Onset   Uterine cancer Mother 54   Heart attack Father    Hypertension Father    Prostate cancer Father    Colon cancer Neg Hx    Breast cancer Neg Hx    Social History:  reports that she has never smoked. She has never used smokeless tobacco. She reports current alcohol use. She reports that she does not use  drugs.  Review of Systems  Genitourinary:  Positive for flank pain.  All other systems reviewed and are negative.  Physical Exam:  Vital signs in last 24 hours: Temp:  [97.9 F (36.6 C)] 97.9 F (36.6 C) (11/18 2323) Pulse Rate:  [62-94] 78 (11/19 0800) Resp:  [14-17] 15 (11/19 0800) BP: (107-148)/(67-101) 107/71 (11/19 0800) SpO2:  [91 %-98 %] 94 % (11/19 0800) Physical Exam Vitals reviewed.  HENT:     Head: Normocephalic and atraumatic.     Mouth/Throat:     Mouth: Mucous membranes are dry.  Eyes:     Extraocular Movements: Extraocular movements intact.     Pupils: Pupils are equal, round, and reactive to light.  Cardiovascular:     Rate and Rhythm: Normal rate and regular rhythm.  Pulmonary:     Effort: Pulmonary effort is normal.     Breath sounds: Normal breath sounds.  Abdominal:     General: Abdomen is flat. There is no distension.  Musculoskeletal:        General: No swelling. Normal range of motion.     Cervical back: Normal range of motion and neck supple.  Skin:    General: Skin is warm and dry.  Neurological:     General: No focal deficit present.     Mental Status: She is alert and oriented to person, place, and time.  Psychiatric:        Mood and Affect: Mood normal.  Behavior: Behavior normal.        Thought Content: Thought content normal.        Judgment: Judgment normal.    Laboratory Data:  Results for orders placed or performed during the hospital encounter of 08/11/21 (from the past 72 hour(s))  Resp Panel by RT-PCR (Flu A&B, Covid) Nasopharyngeal Swab     Status: None   Collection Time: 08/12/21 12:00 AM   Specimen: Nasopharyngeal Swab; Nasopharyngeal(NP) swabs in vial transport medium  Result Value Ref Range   SARS Coronavirus 2 by RT PCR NEGATIVE NEGATIVE    Comment: (NOTE) SARS-CoV-2 target nucleic acids are NOT DETECTED.  The SARS-CoV-2 RNA is generally detectable in upper respiratory specimens during the acute phase of  infection. The lowest concentration of SARS-CoV-2 viral copies this assay can detect is 138 copies/mL. A negative result does not preclude SARS-Cov-2 infection and should not be used as the sole basis for treatment or other patient management decisions. A negative result may occur with  improper specimen collection/handling, submission of specimen other than nasopharyngeal swab, presence of viral mutation(s) within the areas targeted by this assay, and inadequate number of viral copies(<138 copies/mL). A negative result must be combined with clinical observations, patient history, and epidemiological information. The expected result is Negative.  Fact Sheet for Patients:  EntrepreneurPulse.com.au  Fact Sheet for Healthcare Providers:  IncredibleEmployment.be  This test is no t yet approved or cleared by the [place] FDA and  has been authorized for detection and/or diagnosis of SARS-CoV-2 by FDA under an Emergency Use Authorization (EUA). This EUA will remain  in effect (meaning this test can be used) for the duration of the COVID-19 declaration under Section 564(b)(1) of the Act, 21 U.S.C.section 360bbb-3(b)(1), unless the authorization is terminated  or revoked sooner.       Influenza A by PCR NEGATIVE NEGATIVE   Influenza B by PCR NEGATIVE NEGATIVE    Comment: (NOTE) The Xpert Xpress SARS-CoV-2/FLU/RSV plus assay is intended as an aid in the diagnosis of influenza from Nasopharyngeal swab specimens and should not be used as a sole basis for treatment. Nasal washings and aspirates are unacceptable for Xpert Xpress SARS-CoV-2/FLU/RSV testing.  Fact Sheet for Patients: EntrepreneurPulse.com.au  Fact Sheet for Healthcare Providers: IncredibleEmployment.be  This test is not yet approved or cleared by the [place] FDA and has been authorized for detection and/or diagnosis of SARS-CoV-2 by FDA  under an Emergency Use Authorization (EUA). This EUA will remain in effect (meaning this test can be used) for the duration of the COVID-19 declaration under Section 564(b)(1) of the Act, 21 U.S.C. section 360bbb-3(b)(1), unless the authorization is terminated or revoked.  Performed at [facility], [place] [address] [place], [place] [address]   CBC with Differential     Status: Abnormal   Collection Time: 08/12/21 12:30 AM  Result Value Ref Range   WBC 10.9 (H) 4.0 - 10.5 K/uL   RBC 4.46 3.87 - 5.11 MIL/uL   Hemoglobin 12.1 12.0 - 15.0 g/dL   HCT 38.3 36.0 - 46.0 %   MCV 85.9 80.0 - 100.0 fL   MCH 27.1 26.0 - 34.0 pg   MCHC 31.6 30.0 - 36.0 g/dL   RDW 13.1 11.5 - 15.5 %   Platelets 694 (H) 150 - 400 K/uL   nRBC 0.0 0.0 - 0.2 %   Neutrophils Relative % 71 %   Neutro Abs 7.8 (H) 1.7 - 7.7 K/uL   Lymphocytes Relative 19 %   Lymphs  Abs 2.1 0.7 - 4.0 K/uL   Monocytes Relative 7 %   Monocytes Absolute 0.8 0.1 - 1.0 K/uL   Eosinophils Relative 1 %   Eosinophils Absolute 0.1 0.0 - 0.5 K/uL   Basophils Relative 1 %   Basophils Absolute 0.1 0.0 - 0.1 K/uL   Immature Granulocytes 1 %   Abs Immature Granulocytes 0.09 (H) 0.00 - 0.07 K/uL    Comment: Performed at [facility], [place] [address] [place], [place] [address]  Comprehensive metabolic panel     Status: Abnormal   Collection Time: 08/12/21 12:30 AM  Result Value Ref Range   Sodium 134 (L) 135 - 145 mmol/L   Potassium 3.8 3.5 - 5.1 mmol/L   Chloride 101 98 - 111 mmol/L   CO2 24 22 - 32 mmol/L   Glucose, Bld 100 (H) 70 - 99 mg/dL    Comment: Glucose reference range applies only to samples taken after fasting for at least 8 hours.   BUN 12 6 - 20 mg/dL   Creatinine, Ser 0.56 0.44 - 1.00 mg/dL   Calcium 8.7 (L) 8.9 - 10.3 mg/dL   Total Protein 8.1 6.5 - 8.1 g/dL   Albumin 3.3 (L) 3.5 - 5.0 g/dL   AST 36 15 - 41 U/L   ALT 85 (H) 0 - 44 U/L   Alkaline Phosphatase 69 38 - 126 U/L   Total  Bilirubin 0.4 0.3 - 1.2 mg/dL   GFR, Estimated >60 >60 mL/min    Comment: (NOTE) Calculated using the CKD-EPI Creatinine Equation (2021)    Anion gap 9 5 - 15    Comment: Performed at [facility], [place] [address] [place], [place] [address]  Lactic acid, plasma     Status: None   Collection Time: 08/12/21 12:30 AM  Result Value Ref Range   Lactic Acid, Venous 1.7 0.5 - 1.9 mmol/L    Comment: Performed at [facility] [place], [place] [address], [place], [place] [address]  Urinalysis, Routine w reflex microscopic     Status: Abnormal   Collection Time: 08/12/21 12:30 AM  Result Value Ref Range   Color, Urine STRAW (A) YELLOW   APPearance CLEAR CLEAR   Specific Gravity, Urine 1.031 (H) 1.005 - 1.030   pH 6.0 5.0 - 8.0   Glucose, UA NEGATIVE NEGATIVE mg/dL   Hgb urine dipstick MODERATE (A) NEGATIVE   Bilirubin Urine NEGATIVE NEGATIVE   Ketones, ur NEGATIVE NEGATIVE mg/dL   Protein, ur NEGATIVE NEGATIVE mg/dL   Nitrite NEGATIVE NEGATIVE   Leukocytes,Ua NEGATIVE NEGATIVE   RBC / HPF 0-5 0 - 5 RBC/hpf   WBC, UA 6-10 0 - 5 WBC/hpf   Bacteria, UA NONE SEEN NONE SEEN   Squamous Epithelial / LPF 0-5 0 - 5    Comment: Performed at [facility] [place] [address], [place], [place] [address]  CBC     Status: Abnormal   Collection Time: 08/12/21  2:25 AM  Result Value Ref Range   WBC 10.7 (H) 4.0 - 10.5 K/uL   RBC 4.14 3.87 - 5.11 MIL/uL   Hemoglobin 11.3 (L) 12.0 - 15.0 g/dL   HCT 35.0 (L) 36.0 - 46.0 %   MCV 84.5 80.0 - 100.0 fL   MCH 27.3 26.0 - 34.0 pg   MCHC 32.3 30.0 - 36.0 g/dL   RDW 13.2 11.5 - 15.5 %   Platelets 646 (H) 150 - 400 K/uL   nRBC 0.0 0.0 - 0.2 %  Comment: Performed at [facility], [place] [address], [place], [place] [address]  Basic metabolic panel     Status: Abnormal   Collection Time: 08/12/21  2:25 AM  Result Value Ref Range   Sodium 137 135 - 145 mmol/L   Potassium 3.7 3.5 - 5.1 mmol/L    Chloride 105 98 - 111 mmol/L   CO2 23 22 - 32 mmol/L   Glucose, Bld 102 (H) 70 - 99 mg/dL    Comment: Glucose reference range applies only to samples taken after fasting for at least 8 hours.   BUN 11 6 - 20 mg/dL   Creatinine, Ser 0.47 0.44 - 1.00 mg/dL   Calcium 8.5 (L) 8.9 - 10.3 mg/dL   GFR, Estimated >60 >60 mL/min    Comment: (NOTE) Calculated using the CKD-EPI Creatinine Equation (2021)    Anion gap 9 5 - 15    Comment: Performed at [facility], [place] [address] [place], [place] [address]   Recent Results (from the past 240 hour(s))  Resp Panel by RT-PCR (Flu A&B, Covid) Nasopharyngeal Swab     Status: None   Collection Time: 08/12/21 12:00 AM   Specimen: Nasopharyngeal Swab; Nasopharyngeal(NP) swabs in vial transport medium  Result Value Ref Range Status   SARS Coronavirus 2 by RT PCR NEGATIVE NEGATIVE Final    Comment: (NOTE) SARS-CoV-2 target nucleic acids are NOT DETECTED.  The SARS-CoV-2 RNA is generally detectable in upper respiratory specimens during the acute phase of infection. The lowest concentration of SARS-CoV-2 viral copies this assay can detect is 138 copies/mL. A negative result does not preclude SARS-Cov-2 infection and should not be used as the sole basis for treatment or other patient management decisions. A negative result may occur with  improper specimen collection/handling, submission of specimen other than nasopharyngeal swab, presence of viral mutation(s) within the areas targeted by this assay, and inadequate number of viral copies(<138 copies/mL). A negative result must be combined with clinical observations, patient history, and epidemiological information. The expected result is Negative.  Fact Sheet for Patients:  EntrepreneurPulse.com.au  Fact Sheet for Healthcare Providers:  IncredibleEmployment.be  This test is no t yet approved or cleared by the [place] FDA and  has been  authorized for detection and/or diagnosis of SARS-CoV-2 by FDA under an Emergency Use Authorization (EUA). This EUA will remain  in effect (meaning this test can be used) for the duration of the COVID-19 declaration under Section 564(b)(1) of the Act, 21 U.S.C.section 360bbb-3(b)(1), unless the authorization is terminated  or revoked sooner.       Influenza A by PCR NEGATIVE NEGATIVE Final   Influenza B by PCR NEGATIVE NEGATIVE Final    Comment: (NOTE) The Xpert Xpress SARS-CoV-2/FLU/RSV plus assay is intended as an aid in the diagnosis of influenza from Nasopharyngeal swab specimens and should not be used as a sole basis for treatment. Nasal washings and aspirates are unacceptable for Xpert Xpress SARS-CoV-2/FLU/RSV testing.  Fact Sheet for Patients: EntrepreneurPulse.com.au  Fact Sheet for Healthcare Providers: IncredibleEmployment.be  This test is not yet approved or cleared by the [place] FDA and has been authorized for detection and/or diagnosis of SARS-CoV-2 by FDA under an Emergency Use Authorization (EUA). This EUA will remain in effect (meaning this test can be used) for the duration of the COVID-19 declaration under Section 564(b)(1) of the Act, 21 U.S.C. section 360bbb-3(b)(1), unless the authorization is terminated or revoked.  Performed at [facility], [address]  [place], [place] [address]    Creatinine: Recent Labs    08/12/21 0030 08/12/21 0225  CREATININE 0.56 0.47   Baseline Creatinine: 0.5  Impression/Assessment:  55yo with left renal abscess  Plan:  I discussed the management of renal abscesses with the patient. Since this abscess is over 3cm she would benefit from left drain placement by Interventional Radiology. Please continue broad spectrum antibiotics pending abscess culture.   Nicolette Bang 08/12/2021, 9:55 AM

## 2021-08-13 ENCOUNTER — Other Ambulatory Visit: Payer: Self-pay | Admitting: Radiology

## 2021-08-13 DIAGNOSIS — N151 Renal and perinephric abscess: Secondary | ICD-10-CM

## 2021-08-13 LAB — BASIC METABOLIC PANEL
Anion gap: 9 (ref 5–15)
BUN: 11 mg/dL (ref 6–20)
CO2: 25 mmol/L (ref 22–32)
Calcium: 8.4 mg/dL — ABNORMAL LOW (ref 8.9–10.3)
Chloride: 102 mmol/L (ref 98–111)
Creatinine, Ser: 0.65 mg/dL (ref 0.44–1.00)
GFR, Estimated: >60 mL/min (ref >60)
Glucose, Bld: 117 mg/dL — ABNORMAL HIGH (ref 70–99)
Potassium: 4.2 mmol/L (ref 3.5–5.1)
Sodium: 136 mmol/L (ref 135–145)

## 2021-08-13 LAB — CBC WITH DIFFERENTIAL/PLATELET
Abs Immature Granulocytes: 0.12 10*3/uL — ABNORMAL HIGH (ref 0.00–0.07)
Basophils Absolute: 0 10*3/uL (ref 0.0–0.1)
Basophils Relative: 0 %
Eosinophils Absolute: 0 10*3/uL (ref 0.0–0.5)
Eosinophils Relative: 0 %
HCT: 34.6 % — ABNORMAL LOW (ref 36.0–46.0)
Hemoglobin: 11.1 g/dL — ABNORMAL LOW (ref 12.0–15.0)
Immature Granulocytes: 1 %
Lymphocytes Relative: 4 %
Lymphs Abs: 0.8 10*3/uL (ref 0.7–4.0)
MCH: 27.5 pg (ref 26.0–34.0)
MCHC: 32.1 g/dL (ref 30.0–36.0)
MCV: 85.6 fL (ref 80.0–100.0)
Monocytes Absolute: 0.6 10*3/uL (ref 0.1–1.0)
Monocytes Relative: 3 %
Neutro Abs: 18 10*3/uL — ABNORMAL HIGH (ref 1.7–7.7)
Neutrophils Relative %: 92 %
Platelets: 575 10*3/uL — ABNORMAL HIGH (ref 150–400)
RBC: 4.04 MIL/uL (ref 3.87–5.11)
RDW: 13.1 % (ref 11.5–15.5)
WBC: 19.6 10*3/uL — ABNORMAL HIGH (ref 4.0–10.5)
nRBC: 0 % (ref 0.0–0.2)

## 2021-08-13 LAB — URINE CULTURE: Culture: NO GROWTH

## 2021-08-13 MED ORDER — PIPERACILLIN-TAZOBACTAM 3.375 G IVPB
3.3750 g | Freq: Three times a day (TID) | INTRAVENOUS | Status: DC
  Administered 2021-08-13 – 2021-08-15 (×7): 3.375 g via INTRAVENOUS
  Filled 2021-08-13 (×7): qty 50

## 2021-08-13 NOTE — Progress Notes (Signed)
Pharmacy Antibiotic Note  Jacqueline Finley is a 55 y.o. female who was recently hospitalized and discharged on 08/02/21 from [facility]. During this admission, she underwent stent placement for left ureteral stone on 07/31/21 and treated with meropenem for pyelonephritis.  She presented back to the ED on 08/11/2021 with c/o fever and left flank pain. Abdominal CT showed left renal abscess and findings consistent with pyelonephritis. She was started on ceftriaxone on admission and underwent drain placement for renal abscess on 08/12/21. Pharmacy has been consulted on 11/20 to broaden her abx to zosyn for infection.  Plan: - zosyn 3.375 gm IV q8h (infuse over 4 hrs) - With stable renal function, pharmacy will sign off for abx consult.  Reconsult [place] if need further assistance.  __________________________________________  Height: 5\' 4"  (162.6 cm) Weight: 76.4 kg (168 lb 6.9 oz) IBW/kg (Calculated) : 54.7  Temp (24hrs), Avg:99 F (37.2 C), Min:98.4 F (36.9 C), Max:99.4 F (37.4 C)  Recent Labs  Lab 08/12/21 0030 08/12/21 0225 08/13/21 0433  WBC 10.9* 10.7* 19.6*  CREATININE 0.56 0.47 0.65  LATICACIDVEN 1.7  --   --     Estimated Creatinine Clearance: 77.7 mL/min (by C-G formula based on SCr of 0.65 mg/dL).    Allergies  Allergen Reactions   Morphine Nausea Only   Scopolamine Other (See Comments)    Accute angle closure glaucoma     Thank you for allowing pharmacy to be a part of this patient's care.  Lynelle Doctor 08/13/2021 8:15 AM

## 2021-08-13 NOTE — Progress Notes (Signed)
Referring Physician(s): Dr. Sheran Luz  Supervising Physician: Corrie Mckusick  Patient Status:  [facility] - In-pt  Chief Complaint:  Left renal abscess sp left renal abscess drain placement by Dr. Earleen Newport on 11.19.22  Subjective:  Patient laying down in bed. Boyfriend at bedside. Patient reporting pain at drain site.  Allergies: Morphine and Scopolamine  Medications: Prior to Admission medications   Medication Sig Start Date End Date Taking? Authorizing Provider  bimatoprost (LATISSE) 0.03 % ophthalmic solution Place 1 application into both eyes See admin instructions. Apply topically along upper eyelid margin at base of eyelashes daily at bedtime 06/09/21  Yes [provider]  hydrochlorothiazide (HYDRODIURIL) 25 MG tablet TAKE 1 TABLET BY MOUTH EVERY DAY IN THE MORNING Patient taking differently: Take 25 mg by mouth every morning. 06/30/21  Yes Worley, Samantha, PA  SYNTHROID 112 MCG tablet TAKE 1 TABLET BY MOUTH EVERY MORNING ON AN EMPTY STOMACH WITH A GLASS OF WATER AT LEAST 30 TO 60 MINUTES BEFORE BREAKFAST Patient taking differently: Take 112 mcg by mouth daily before breakfast. 06/30/21  Yes Inda Coke, PA  venlafaxine XR (EFFEXOR-XR) 150 MG 24 hr capsule Take 1 capsule (150 mg total) by mouth 2 (two) times daily. Patient taking differently: Take 300 mg by mouth every morning. 06/30/21 09/28/21 Yes Inda Coke, PA     Vital Signs: BP 138/79 (BP Location: Left Arm)   Pulse (!) 105   Temp 99.2 F (37.3 C) (Oral)   Resp 16   Ht 5\' 4"  (1.626 m)   Wt 168 lb 6.9 oz (76.4 kg)   SpO2 94%   BMI 28.91 kg/m   Physical Exam Vitals and nursing note reviewed.  Constitutional:      Appearance: She is well-developed.  HENT:     Head: Normocephalic and atraumatic.  Eyes:     Conjunctiva/sclera: Conjunctivae normal.  Pulmonary:     Effort: Pulmonary effort is normal.  Genitourinary:    Comments: Drain Location: Superior aspect of left kidney Size: Fr size: 12  Fr Date of placement:  11.19.22  Currently to: Drain collection device: suction bulb 24 hour output:  - < 5 ml of serosanguinous output noted to be in the JP drain.  Musculoskeletal:     Cervical back: Normal range of motion.  Skin:    General: Skin is warm and dry.  Neurological:     Mental Status: She is alert and oriented to person, place, and time.    Imaging: CT ABDOMEN PELVIS W WO CONTRAST  Result Date: 08/11/2021 CLINICAL DATA:  History of known kidney infection EXAM: CT ANGIOGRAPHY CHEST CT ABDOMEN AND PELVIS WITH CONTRAST TECHNIQUE: Multidetector CT imaging of the chest was performed using the standard protocol during bolus administration of intravenous contrast. Multiplanar CT image reconstructions and MIPs were obtained to evaluate the vascular anatomy. Multidetector CT imaging of the abdomen and pelvis was performed using the standard protocol during bolus administration of intravenous contrast. CONTRAST:  180mL OMNIPAQUE IOHEXOL 350 MG/ML SOLN COMPARISON:  08/10/2021 noncontrast CT of the abdomen FINDINGS: CTA CHEST FINDINGS Cardiovascular: Thoracic aorta and its branches are within normal limits. No aneurysmal dilatation or dissection is noted. Heart is mildly enlarged in size. The pulmonary artery is well visualized within normal branching pattern. No focal filling defect to suggest pulmonary embolism is identified. Mediastinum/Nodes: Thoracic inlet is within normal limits. Esophagus as visualized is unremarkable. No sizable hilar or mediastinal adenopathy is noted. Lungs/Pleura: Lungs are well aerated bilaterally and demonstrate mild bibasilar atelectasis. Minimal  emphysematous changes are seen. No focal infiltrate or sizable effusion is seen. No sizable parenchymal nodules are noted. Musculoskeletal: Degenerative changes of the thoracic spine are noted. No acute rib abnormality is noted. Review of the MIP images confirms the above findings. CT ABDOMEN and PELVIS FINDINGS  Hepatobiliary: No focal liver abnormality is seen. No gallstones, gallbladder wall thickening, or biliary dilatation. Pancreas: Unremarkable. No pancreatic ductal dilatation or surrounding inflammatory changes. Spleen: Normal in size without focal abnormality. Adrenals/Urinary Tract: Adrenal glands are within normal limits. Kidneys demonstrate a normal enhancement pattern on the right. Left kidney demonstrates a large irregular fluid attenuation collection in the upper pole measuring 4.3 x 4.0 cm in greatest AP and transverse dimensions. It extends for approximately 3.6 cm in craniocaudad projection. Delayed images show multiple areas of decreased enhancement consistent with pyelonephritis. A small subcapsular fluid collection is noted in the lower pole medially likely of similar etiology. No obstructive changes are seen. No ureteral stones are noted. The bladder is decompressed. Tiny nonobstructing lower pole stone is noted on the right. This is stable from the most recent exam. Stomach/Bowel: No obstructive changes in the small bowel are noted. Stomach is within normal limits. Colon shows scattered diverticular change without evidence of diverticulitis. No obstructive changes are seen. The appendix is within normal limits. Vascular/Lymphatic: Aortic atherosclerosis. No enlarged abdominal or pelvic lymph nodes. Reproductive: Uterine fibroids are seen. Largest of these measures 3.7 cm. No adnexal mass is noted. Other: No abdominal wall hernia or abnormality. No abdominopelvic ascites. Musculoskeletal: No acute or significant osseous findings. Review of the MIP images confirms the above findings. IMPRESSION: CTA of the chest: No evidence of acute pulmonary emboli. Mild emphysematous changes without acute infiltrate. CT of the abdomen and pelvis: In the upper pole of the left kidney, there is an irregular fluid collection identified consistent with focal renal abscess. This is similar to that seen on prior exam but  better delineated due to the contrast enhancement. Additionally a small subcapsular fluid collection is noted inferiorly as well as multiple areas of decreased enhancement on delayed images. These changes are consistent with pyelonephritis. Uterine fibroid disease. Diverticulosis without diverticulitis. Tiny nonobstructing right renal stone stable from the previous day. Critical Value/emergent results were called by telephone at the time of interpretation on 08/11/2021 at 10:33 pm to Dr. Alyson Ingles, Who verbally acknowledged these results. Electronically Signed   By: Inez Catalina M.D.   On: 08/11/2021 22:36   CT Angio Chest Pulmonary Embolism (PE) W or WO Contrast  Result Date: 08/11/2021 CLINICAL DATA:  History of known kidney infection EXAM: CT ANGIOGRAPHY CHEST CT ABDOMEN AND PELVIS WITH CONTRAST TECHNIQUE: Multidetector CT imaging of the chest was performed using the standard protocol during bolus administration of intravenous contrast. Multiplanar CT image reconstructions and MIPs were obtained to evaluate the vascular anatomy. Multidetector CT imaging of the abdomen and pelvis was performed using the standard protocol during bolus administration of intravenous contrast. CONTRAST:  125mL OMNIPAQUE IOHEXOL 350 MG/ML SOLN COMPARISON:  08/10/2021 noncontrast CT of the abdomen FINDINGS: CTA CHEST FINDINGS Cardiovascular: Thoracic aorta and its branches are within normal limits. No aneurysmal dilatation or dissection is noted. Heart is mildly enlarged in size. The pulmonary artery is well visualized within normal branching pattern. No focal filling defect to suggest pulmonary embolism is identified. Mediastinum/Nodes: Thoracic inlet is within normal limits. Esophagus as visualized is unremarkable. No sizable hilar or mediastinal adenopathy is noted. Lungs/Pleura: Lungs are well aerated bilaterally and demonstrate mild bibasilar atelectasis. Minimal emphysematous  changes are seen. No focal infiltrate or sizable  effusion is seen. No sizable parenchymal nodules are noted. Musculoskeletal: Degenerative changes of the thoracic spine are noted. No acute rib abnormality is noted. Review of the MIP images confirms the above findings. CT ABDOMEN and PELVIS FINDINGS Hepatobiliary: No focal liver abnormality is seen. No gallstones, gallbladder wall thickening, or biliary dilatation. Pancreas: Unremarkable. No pancreatic ductal dilatation or surrounding inflammatory changes. Spleen: Normal in size without focal abnormality. Adrenals/Urinary Tract: Adrenal glands are within normal limits. Kidneys demonstrate a normal enhancement pattern on the right. Left kidney demonstrates a large irregular fluid attenuation collection in the upper pole measuring 4.3 x 4.0 cm in greatest AP and transverse dimensions. It extends for approximately 3.6 cm in craniocaudad projection. Delayed images show multiple areas of decreased enhancement consistent with pyelonephritis. A small subcapsular fluid collection is noted in the lower pole medially likely of similar etiology. No obstructive changes are seen. No ureteral stones are noted. The bladder is decompressed. Tiny nonobstructing lower pole stone is noted on the right. This is stable from the most recent exam. Stomach/Bowel: No obstructive changes in the small bowel are noted. Stomach is within normal limits. Colon shows scattered diverticular change without evidence of diverticulitis. No obstructive changes are seen. The appendix is within normal limits. Vascular/Lymphatic: Aortic atherosclerosis. No enlarged abdominal or pelvic lymph nodes. Reproductive: Uterine fibroids are seen. Largest of these measures 3.7 cm. No adnexal mass is noted. Other: No abdominal wall hernia or abnormality. No abdominopelvic ascites. Musculoskeletal: No acute or significant osseous findings. Review of the MIP images confirms the above findings. IMPRESSION: CTA of the chest: No evidence of acute pulmonary emboli. Mild  emphysematous changes without acute infiltrate. CT of the abdomen and pelvis: In the upper pole of the left kidney, there is an irregular fluid collection identified consistent with focal renal abscess. This is similar to that seen on prior exam but better delineated due to the contrast enhancement. Additionally a small subcapsular fluid collection is noted inferiorly as well as multiple areas of decreased enhancement on delayed images. These changes are consistent with pyelonephritis. Uterine fibroid disease. Diverticulosis without diverticulitis. Tiny nonobstructing right renal stone stable from the previous day. Critical Value/emergent results were called by telephone at the time of interpretation on 08/11/2021 at 10:33 pm to Dr. Alyson Ingles, Who verbally acknowledged these results. Electronically Signed   By: Inez Catalina M.D.   On: 08/11/2021 22:36   CT IMAGE GUIDED DRAINAGE BY PERCUTANEOUS CATHETER  Result Date: 08/13/2021 INDICATION: 55 year old female with a left renal abscess referred for drainage EXAM: CT GUIDED DRAINAGE OF  ABSCESS MEDICATIONS: The patient is currently admitted to the hospital and receiving intravenous antibiotics. The antibiotics were administered within an appropriate time frame prior to the initiation of the procedure. ANESTHESIA/SEDATION: 2.0 mg IV Versed 100 mcg IV Fentanyl Moderate Sedation Time:  16 minutes The patient was continuously monitored during the procedure by the interventional radiology nurse under my direct supervision. COMPLICATIONS: None TECHNIQUE: Informed written consent was obtained from the patient after a thorough discussion of the procedural risks, benefits and alternatives. All questions were addressed. Maximal Sterile Barrier Technique was utilized including caps, mask, sterile gowns, sterile gloves, sterile drape, hand hygiene and skin antiseptic. A timeout was performed prior to the initiation of the procedure. PROCEDURE: Patient was position prone  position on the CT gantry table and a scout CT of the upper abdomen was performed. The left flank was prepped with chlorhexidine in a sterile fashion, and  a sterile drape was applied covering the operative field. A sterile gown and sterile gloves were used for the procedure. Local anesthesia was provided with 1% Lidocaine. Once the patient is prepped and draped in the usual sterile fashion and the skin was anesthetized overlying the left kidney, a Yueh needle was advanced with CT guidance from just adjacent to the left twelfth rib, directed in a cephalad trajectory, in an effort to avoid the lowest margin of the diaphragm. Given the position of the abscess at the superior pole of the left kidney, this cephalad trajectory was necessary. Once complex fluid was aspirated from the Yueh needle, modified Seldinger technique was used to place a 12 [place] drain into the abscess. Aspiration of purulent material was sent for culture. The drain was attached to bulb suction. Drain was sutured in position. Patient tolerated the procedure well and remained hemodynamically stable throughout. No complications were encountered and no significant blood loss. FINDINGS: Twelve French drain into the superior position abscess within the left renal cortex. IMPRESSION: Status post CT-guided drainage of left renal abscess at the superior pole. Signed, Dulcy Fanny. Dellia Nims, RPVI [facility] Electronically Signed   By: Corrie Mckusick D.O.   On: 08/13/2021 08:28    Labs:  CBC: Recent Labs    08/01/21 0430 08/12/21 0030 08/12/21 0225 08/13/21 0433  WBC 10.3 10.9* 10.7* 19.6*  HGB 12.6 12.1 11.3* 11.1*  HCT 37.5 38.3 35.0* 34.6*  PLT 243 694* 646* 575*    COAGS: No results for input(s): INR, APTT in the last 8760 hours.  BMP: Recent Labs    08/02/21 0441 08/12/21 0030 08/12/21 0225 08/13/21 0433  NA 135 134* 137 136  K 4.0 3.8 3.7 4.2  CL 105 101 105 102  CO2  24 24 23 25   GLUCOSE 127* 100* 102* 117*  BUN 16 12 11 11   CALCIUM 7.9* 8.7* 8.5* 8.4*  CREATININE 0.54 0.56 0.47 0.65  GFRNONAA >60 >60 >60 >60    LIVER FUNCTION TESTS: Recent Labs    06/30/21 1536 07/31/21 1641 08/01/21 0430 08/12/21 0030  BILITOT 0.2 1.0 0.2* 0.4  AST 28 29 24  36  ALT 43* 20 25 85*  ALKPHOS  --  69 62 69  PROT 7.3 7.7 7.2 8.1  ALBUMIN  --  3.5 2.8* 3.3*    Assessment and Plan:  55 y.o. female inpatient. (In ED). History of HTN, recent admission for left ureteral stone s/p stent placement. Cultures from urology grew K. Pneumo. Presented o the ED at [facility] with recurrent fevers, and intermittent left flank pain. Found to have a left renal abscess. CT abs pelvis from  11.18.22 reads : In the upper pole of the left kidney, there is an irregular fluid collection identified consistent with focal renal abscess. This is similar to that seen on prior exam but better delineated due to the contrast enhancement. Of note there is no stent present on CT scan.  IR Attending Dr. Earleen Newport placed an 12 Fr  abscess drain on 11.19.22.     Drain Location: Superior aspect of left kidney Size: Fr size: 12 Fr Date of placement:  11.19.22  Currently to: Drain collection device: suction bulb 24 hour output:  - < 5 ml of serosanguinous output noted to be in the JP drain.  Output by Drain (mL) 08/11/21 0701 - 08/11/21 1900 08/11/21 1901 - 08/12/21 0700 08/12/21 0701 - 08/12/21 1900 08/12/21 1901 - 08/13/21 0700 08/13/21 0701 - 08/13/21 1205  Closed System Drain Left Flank Bulb (JP) 12 Fr.   10 100 10    Interval imaging/drain manipulation:  None since placement  Current examination: Flushes/aspirates easily.  Insertion site unremarkable. Suture and stat lock in place. Dressed appropriately.   Plan: Continue TID flushes with 5 cc NS. Record output Q shift. Dressing changes QD or PRN if soiled.  Call IR APP or on call IR MD if difficulty flushing or sudden change in drain output.   Repeat imaging/possible drain injection once output < 10 mL/QD (excluding flush material.)   Drain functioning well, flushes/aspirates easily without pain. She is appropriately tender to palpation at insertion site without any significant bruising/swelling.    Given drain is functioning well there is little concern for drain malposition, there is no bruising or swelling at the insertion site concerning for hematoma. Because the drain was placed via intercostal approach and the patient's main complaints of pain are with inspiration she is likely experiencing pain due to inflammation/irritation related to catheter position. It is not abnormal for this type of pain to occur for several days post procedure - I discussed this with patient today and reassured them that there is no immediate concern regarding the drain, however we will continue to follow and if her symptoms continue to worsen will plan for follow imaging/possible drain injection.   Per chart patient started  morphine  pain medication which patient states improves her pain. -Would recommend continuing medical management  as needed. If pain worsens or new concerning signs and symptoms develop please call IR.   Discharge planning: Please contact IR APP or on call IR MD prior to patient d/c to ensure appropriate follow up plans are in place. Typically patient will follow up with IR clinic 10-14 days post d/c for repeat imaging/possible drain injection. IR scheduler will contact patient with date/time of appointment. Patient will need to flush drain QD with 5 cc NS, record output QD, dressing changes every 2-3 days or earlier if soiled.   IR will continue to follow - please call with questions or concerns.    Electronically Signed: Jacqualine Mau, NP 08/13/2021, 12:04 PM   I spent a total of 15 Minutes at the patient's bedside AND on the patient's hospital floor or unit, greater than 50% of which was counseling/coordinating care for  left renal abscess drain.

## 2021-08-13 NOTE — Progress Notes (Signed)
Subjective: Patient reports worsening left flank pain after drain placement. Temp 99.4 this morning. No nausea/vomiting.  Objective: Vital signs in last 24 hours: Temp:  [98.4 F (36.9 C)-99.4 F (37.4 C)] 99.2 F (37.3 C) (11/20 1004) Pulse Rate:  [72-107] 105 (11/20 1004) Resp:  [16-23] 16 (11/20 1004) BP: (114-157)/(56-87) 138/79 (11/20 1004) SpO2:  [94 %-100 %] 94 % (11/20 1004) Weight:  [76.4 kg] 76.4 kg (11/19 1815)  Intake/Output from previous day: 11/19 0701 - 11/20 0700 In: 1949.9 [P.O.:770; I.V.:1070; IV Piggyback:99.9] Out: 1170 [Urine:1060; Drains:110] Intake/Output this shift: Total I/O In: -  Out: 410 [Urine:400; Drains:10]  Physical Exam:  General:alert, cooperative, and appears stated age GI: soft, non tender, normal bowel sounds, no palpable masses, no organomegaly, no inguinal hernia Female genitalia: not done Extremities: extremities normal, atraumatic, no cyanosis or edema  Lab Results: Recent Labs    08/12/21 0030 08/12/21 0225 08/13/21 0433  HGB 12.1 11.3* 11.1*  HCT 38.3 35.0* 34.6*   BMET Recent Labs    08/12/21 0225 08/13/21 0433  NA 137 136  K 3.7 4.2  CL 105 102  CO2 23 25  GLUCOSE 102* 117*  BUN 11 11  CREATININE 0.47 0.65  CALCIUM 8.5* 8.4*   No results for input(s): LABPT, INR in the last 72 hours. No results for input(s): LABURIN in the last 72 hours. Results for orders placed or performed during the hospital encounter of 08/11/21  Resp Panel by RT-PCR (Flu A&B, Covid) Nasopharyngeal Swab     Status: None   Collection Time: 08/12/21 12:00 AM   Specimen: Nasopharyngeal Swab; Nasopharyngeal(NP) swabs in vial transport medium  Result Value Ref Range Status   SARS Coronavirus 2 by RT PCR NEGATIVE NEGATIVE Final    Comment: (NOTE) SARS-CoV-2 target nucleic acids are NOT DETECTED.  The SARS-CoV-2 RNA is generally detectable in upper respiratory specimens during the acute phase of infection. The lowest concentration of  SARS-CoV-2 viral copies this assay can detect is 138 copies/mL. A negative result does not preclude SARS-Cov-2 infection and should not be used as the sole basis for treatment or other patient management decisions. A negative result may occur with  improper specimen collection/handling, submission of specimen other than nasopharyngeal swab, presence of viral mutation(s) within the areas targeted by this assay, and inadequate number of viral copies(<138 copies/mL). A negative result must be combined with clinical observations, patient history, and epidemiological information. The expected result is Negative.  Fact Sheet for Patients:  EntrepreneurPulse.com.au  Fact Sheet for Healthcare Providers:  IncredibleEmployment.be  This test is no t yet approved or cleared by the [place] FDA and  has been authorized for detection and/or diagnosis of SARS-CoV-2 by FDA under an Emergency Use Authorization (EUA). This EUA will remain  in effect (meaning this test can be used) for the duration of the COVID-19 declaration under Section 564(b)(1) of the Act, 21 U.S.C.section 360bbb-3(b)(1), unless the authorization is terminated  or revoked sooner.       Influenza A by PCR NEGATIVE NEGATIVE Final   Influenza B by PCR NEGATIVE NEGATIVE Final    Comment: (NOTE) The Xpert Xpress SARS-CoV-2/FLU/RSV plus assay is intended as an aid in the diagnosis of influenza from Nasopharyngeal swab specimens and should not be used as a sole basis for treatment. Nasal washings and aspirates are unacceptable for Xpert Xpress SARS-CoV-2/FLU/RSV testing.  Fact Sheet for Patients: EntrepreneurPulse.com.au  Fact Sheet for Healthcare Providers: IncredibleEmployment.be  This test is not yet approved or cleared by the [place]  States FDA and has been authorized for detection and/or diagnosis of SARS-CoV-2 by FDA under an Emergency Use  Authorization (EUA). This EUA will remain in effect (meaning this test can be used) for the duration of the COVID-19 declaration under Section 564(b)(1) of the Act, 21 U.S.C. section 360bbb-3(b)(1), unless the authorization is terminated or revoked.  Performed at [facility], [place] [address], [place], [place] [address]   Aerobic/Anaerobic Culture w Gram Stain (surgical/deep wound)     Status: None (Preliminary result)   Collection Time: 08/12/21  4:05 AM   Specimen: Abscess  Result Value Ref Range Status   Specimen Description   Final    ABSCESS LEFT KIDNEY Performed at [facility] [address], [place], [place] [address]    Special Requests   Final    NONE Performed at [facility], [place] [address] [place], [place] [address]    Gram Stain   Final    NO SQUAMOUS EPITHELIAL CELLS SEEN FEW WBC SEEN NO ORGANISMS SEEN Performed at [facility], [place] [address], [place], [place] [address]    Culture PENDING  Incomplete   Report Status PENDING  Incomplete    Studies/Results: CT ABDOMEN PELVIS W WO CONTRAST  Result Date: 08/11/2021 CLINICAL DATA:  History of known kidney infection EXAM: CT ANGIOGRAPHY CHEST CT ABDOMEN AND PELVIS WITH CONTRAST TECHNIQUE: Multidetector CT imaging of the chest was performed using the standard protocol during bolus administration of intravenous contrast. Multiplanar CT image reconstructions and MIPs were obtained to evaluate the vascular anatomy. Multidetector CT imaging of the abdomen and pelvis was performed using the standard protocol during bolus administration of intravenous contrast. CONTRAST:  163mL OMNIPAQUE IOHEXOL 350 MG/ML SOLN COMPARISON:  08/10/2021 noncontrast CT of the abdomen FINDINGS: CTA CHEST FINDINGS Cardiovascular: Thoracic aorta and its branches are within normal limits. No aneurysmal dilatation or dissection is noted. Heart is mildly enlarged in size. The pulmonary artery  is well visualized within normal branching pattern. No focal filling defect to suggest pulmonary embolism is identified. Mediastinum/Nodes: Thoracic inlet is within normal limits. Esophagus as visualized is unremarkable. No sizable hilar or mediastinal adenopathy is noted. Lungs/Pleura: Lungs are well aerated bilaterally and demonstrate mild bibasilar atelectasis. Minimal emphysematous changes are seen. No focal infiltrate or sizable effusion is seen. No sizable parenchymal nodules are noted. Musculoskeletal: Degenerative changes of the thoracic spine are noted. No acute rib abnormality is noted. Review of the MIP images confirms the above findings. CT ABDOMEN and PELVIS FINDINGS Hepatobiliary: No focal liver abnormality is seen. No gallstones, gallbladder wall thickening, or biliary dilatation. Pancreas: Unremarkable. No pancreatic ductal dilatation or surrounding inflammatory changes. Spleen: Normal in size without focal abnormality. Adrenals/Urinary Tract: Adrenal glands are within normal limits. Kidneys demonstrate a normal enhancement pattern on the right. Left kidney demonstrates a large irregular fluid attenuation collection in the upper pole measuring 4.3 x 4.0 cm in greatest AP and transverse dimensions. It extends for approximately 3.6 cm in craniocaudad projection. Delayed images show multiple areas of decreased enhancement consistent with pyelonephritis. A small subcapsular fluid collection is noted in the lower pole medially likely of similar etiology. No obstructive changes are seen. No ureteral stones are noted. The bladder is decompressed. Tiny nonobstructing lower pole stone is noted on the right. This is stable from the most recent exam. Stomach/Bowel: No obstructive changes in the small bowel are noted. Stomach is within normal limits. Colon shows scattered diverticular change without evidence of diverticulitis. No obstructive  changes are seen. The appendix is within normal limits.  Vascular/Lymphatic: Aortic atherosclerosis. No enlarged abdominal or pelvic lymph nodes. Reproductive: Uterine fibroids are seen. Largest of these measures 3.7 cm. No adnexal mass is noted. Other: No abdominal wall hernia or abnormality. No abdominopelvic ascites. Musculoskeletal: No acute or significant osseous findings. Review of the MIP images confirms the above findings. IMPRESSION: CTA of the chest: No evidence of acute pulmonary emboli. Mild emphysematous changes without acute infiltrate. CT of the abdomen and pelvis: In the upper pole of the left kidney, there is an irregular fluid collection identified consistent with focal renal abscess. This is similar to that seen on prior exam but better delineated due to the contrast enhancement. Additionally a small subcapsular fluid collection is noted inferiorly as well as multiple areas of decreased enhancement on delayed images. These changes are consistent with pyelonephritis. Uterine fibroid disease. Diverticulosis without diverticulitis. Tiny nonobstructing right renal stone stable from the previous day. Critical Value/emergent results were called by telephone at the time of interpretation on 08/11/2021 at 10:33 pm to Dr. Alyson Ingles, Who verbally acknowledged these results. Electronically Signed   By: Inez Catalina M.D.   On: 08/11/2021 22:36   CT Angio Chest Pulmonary Embolism (PE) W or WO Contrast  Result Date: 08/11/2021 CLINICAL DATA:  History of known kidney infection EXAM: CT ANGIOGRAPHY CHEST CT ABDOMEN AND PELVIS WITH CONTRAST TECHNIQUE: Multidetector CT imaging of the chest was performed using the standard protocol during bolus administration of intravenous contrast. Multiplanar CT image reconstructions and MIPs were obtained to evaluate the vascular anatomy. Multidetector CT imaging of the abdomen and pelvis was performed using the standard protocol during bolus administration of intravenous contrast. CONTRAST:  148mL OMNIPAQUE IOHEXOL 350 MG/ML SOLN  COMPARISON:  08/10/2021 noncontrast CT of the abdomen FINDINGS: CTA CHEST FINDINGS Cardiovascular: Thoracic aorta and its branches are within normal limits. No aneurysmal dilatation or dissection is noted. Heart is mildly enlarged in size. The pulmonary artery is well visualized within normal branching pattern. No focal filling defect to suggest pulmonary embolism is identified. Mediastinum/Nodes: Thoracic inlet is within normal limits. Esophagus as visualized is unremarkable. No sizable hilar or mediastinal adenopathy is noted. Lungs/Pleura: Lungs are well aerated bilaterally and demonstrate mild bibasilar atelectasis. Minimal emphysematous changes are seen. No focal infiltrate or sizable effusion is seen. No sizable parenchymal nodules are noted. Musculoskeletal: Degenerative changes of the thoracic spine are noted. No acute rib abnormality is noted. Review of the MIP images confirms the above findings. CT ABDOMEN and PELVIS FINDINGS Hepatobiliary: No focal liver abnormality is seen. No gallstones, gallbladder wall thickening, or biliary dilatation. Pancreas: Unremarkable. No pancreatic ductal dilatation or surrounding inflammatory changes. Spleen: Normal in size without focal abnormality. Adrenals/Urinary Tract: Adrenal glands are within normal limits. Kidneys demonstrate a normal enhancement pattern on the right. Left kidney demonstrates a large irregular fluid attenuation collection in the upper pole measuring 4.3 x 4.0 cm in greatest AP and transverse dimensions. It extends for approximately 3.6 cm in craniocaudad projection. Delayed images show multiple areas of decreased enhancement consistent with pyelonephritis. A small subcapsular fluid collection is noted in the lower pole medially likely of similar etiology. No obstructive changes are seen. No ureteral stones are noted. The bladder is decompressed. Tiny nonobstructing lower pole stone is noted on the right. This is stable from the most recent exam.  Stomach/Bowel: No obstructive changes in the small bowel are noted. Stomach is within normal limits. Colon shows scattered diverticular change without evidence of diverticulitis. No obstructive  changes are seen. The appendix is within normal limits. Vascular/Lymphatic: Aortic atherosclerosis. No enlarged abdominal or pelvic lymph nodes. Reproductive: Uterine fibroids are seen. Largest of these measures 3.7 cm. No adnexal mass is noted. Other: No abdominal wall hernia or abnormality. No abdominopelvic ascites. Musculoskeletal: No acute or significant osseous findings. Review of the MIP images confirms the above findings. IMPRESSION: CTA of the chest: No evidence of acute pulmonary emboli. Mild emphysematous changes without acute infiltrate. CT of the abdomen and pelvis: In the upper pole of the left kidney, there is an irregular fluid collection identified consistent with focal renal abscess. This is similar to that seen on prior exam but better delineated due to the contrast enhancement. Additionally a small subcapsular fluid collection is noted inferiorly as well as multiple areas of decreased enhancement on delayed images. These changes are consistent with pyelonephritis. Uterine fibroid disease. Diverticulosis without diverticulitis. Tiny nonobstructing right renal stone stable from the previous day. Critical Value/emergent results were called by telephone at the time of interpretation on 08/11/2021 at 10:33 pm to Dr. Alyson Ingles, Who verbally acknowledged these results. Electronically Signed   By: Inez Catalina M.D.   On: 08/11/2021 22:36   CT IMAGE GUIDED DRAINAGE BY PERCUTANEOUS CATHETER  Result Date: 08/13/2021 INDICATION: 55 year old female with a left renal abscess referred for drainage EXAM: CT GUIDED DRAINAGE OF  ABSCESS MEDICATIONS: The patient is currently admitted to the hospital and receiving intravenous antibiotics. The antibiotics were administered within an appropriate time frame prior to the  initiation of the procedure. ANESTHESIA/SEDATION: 2.0 mg IV Versed 100 mcg IV Fentanyl Moderate Sedation Time:  16 minutes The patient was continuously monitored during the procedure by the interventional radiology nurse under my direct supervision. COMPLICATIONS: None TECHNIQUE: Informed written consent was obtained from the patient after a thorough discussion of the procedural risks, benefits and alternatives. All questions were addressed. Maximal Sterile Barrier Technique was utilized including caps, mask, sterile gowns, sterile gloves, sterile drape, hand hygiene and skin antiseptic. A timeout was performed prior to the initiation of the procedure. PROCEDURE: Patient was position prone position on the CT gantry table and a scout CT of the upper abdomen was performed. The left flank was prepped with chlorhexidine in a sterile fashion, and a sterile drape was applied covering the operative field. A sterile gown and sterile gloves were used for the procedure. Local anesthesia was provided with 1% Lidocaine. Once the patient is prepped and draped in the usual sterile fashion and the skin was anesthetized overlying the left kidney, a Yueh needle was advanced with CT guidance from just adjacent to the left twelfth rib, directed in a cephalad trajectory, in an effort to avoid the lowest margin of the diaphragm. Given the position of the abscess at the superior pole of the left kidney, this cephalad trajectory was necessary. Once complex fluid was aspirated from the Yueh needle, modified Seldinger technique was used to place a 12 [place] drain into the abscess. Aspiration of purulent material was sent for culture. The drain was attached to bulb suction. Drain was sutured in position. Patient tolerated the procedure well and remained hemodynamically stable throughout. No complications were encountered and no significant blood loss. FINDINGS: Twelve French drain into the superior position abscess within the left renal  cortex. IMPRESSION: Status post CT-guided drainage of left renal abscess at the superior pole. Signed, Dulcy Fanny. Dellia Nims, RPVI [facility] Electronically Signed   By: Corrie Mckusick D.O.   On: 08/13/2021  08:28    Assessment/Plan: 55yo with left renal abscess s/p left drain placement  Continue broad spectrum antibiotics pending drain culture Continue current pain control regiment   LOS: 1 day   Nicolette Bang 08/13/2021, 10:45 AM

## 2021-08-13 NOTE — Progress Notes (Signed)
PROGRESS NOTE    Jacqueline Finley  OZH:[id number] DOB: 1966-07-12 DOA: 08/11/2021 PCP: Inda Coke, PA   Chief Complain: Left flank pain, fever  Brief Narrative: Patient is a 55 year old female with history of hypertension, infected left ureteral stone was recently admitted here earlier this month and was treated with a stent placement and was discharged with oral antibiotics came back with continuous low-grade fever, left flank pain at home.  CT abdomen/pelvis done as ordered by urology showed left renal abscess and she was sent to the emergency room for admission.  IR consulted and she underwent drainage on 08/12/21.  Urology  were following.    Assessment & Plan:   Principal Problem:   Renal abscess, left Active Problems:   Hypertension   Klebsiella pneumoniae infection   Sepsis secondary to left renal abscess:history of hypertension, infected left ureteral stone was recently admitted here earlier this month and was treated with a stent placement and was discharged with oral antibiotics came back with continuous low-grade fever, left flank pain at home.  CT abdomen/pelvis done as ordered by urology showed left renal abscess and she was sent to the emergency room for admission.   IR consulted and she underwent drainage on 08/12/21.  Urology  were following.   Continue pain management, supportive care, gentle IV fluids.    Cultures will be followed. Leukocytosis worsened today.  Will change antibiotics to Zosyn.  Check CBC tomorrow  Hypertension: Currently blood pressure stable.  Continue monitoring.  Takes hydrochlorothiazide at home.  History of hypothyroidism: Continue Synthyroid        DVT prophylaxis:SCD Code Status: Full Family Communication: None at bedside Status is: Inpatient      Consultants: Urology, IR  Procedures:  Antimicrobials:  Anti-infectives (From admission, onward)    Start     Dose/Rate Route Frequency Ordered Stop   08/12/21 0130   cefTRIAXone (ROCEPHIN) 2 g in sodium chloride 0.9 % 100 mL IVPB        2 g 200 mL/hr over 30 Minutes Intravenous Every 24 hours 08/12/21 0118         Subjective:  Patient seen and examined the bedside this morning.  Hemodynamically stable.  Complains of pain on the IR drainage site.  Complains of some headache. Objective: Vitals:   08/12/21 1815 08/12/21 2127 08/13/21 0142 08/13/21 0550  BP: 132/80 116/72 (!) 114/56 129/79  Pulse: 90 (!) 107 (!) 103 (!) 102  Resp: 20 18 18 18   Temp: 99 F (37.2 C) 99.1 F (37.3 C) 99.2 F (37.3 C) 99.4 F (37.4 C)  TempSrc: Oral Oral Oral Oral  SpO2: 99% 96% 94% 95%  Weight: 76.4 kg     Height: 5\' 4"  (1.626 m)       Intake/Output Summary (Last 24 hours) at 08/13/2021 0801 Last data filed at 08/13/2021 6295 Gross per 24 hour  Intake 1949.87 ml  Output 1170 ml  Net 779.87 ml   Filed Weights   08/12/21 1815  Weight: 76.4 kg    Examination:  General exam: Overall comfortable, not in distress HEENT: PERRL Respiratory system:  no wheezes or crackles  Cardiovascular system: S1 & S2 heard, RRR.  Gastrointestinal system: Abdomen is nondistended, soft and nontender.  Left-sided pigtail drain on the flank Central nervous system: Alert and oriented Extremities: No edema, no clubbing ,no cyanosis Skin: No rashes, no ulcers,no icterus    Data Reviewed: I have personally reviewed following labs and imaging studies  CBC: Recent Labs  Lab 08/12/21 0030 08/12/21  0225 08/13/21 0433  WBC 10.9* 10.7* 19.6*  NEUTROABS 7.8*  --  18.0*  HGB 12.1 11.3* 11.1*  HCT 38.3 35.0* 34.6*  MCV 85.9 84.5 85.6  PLT 694* 646* 865*   Basic Metabolic Panel: Recent Labs  Lab 08/12/21 0030 08/12/21 0225 08/13/21 0433  NA 134* 137 136  K 3.8 3.7 4.2  CL 101 105 102  CO2 24 23 25   GLUCOSE 100* 102* 117*  BUN 12 11 11   CREATININE 0.56 0.47 0.65  CALCIUM 8.7* 8.5* 8.4*   GFR: Estimated Creatinine Clearance: 77.7 mL/min (by C-G formula based on SCr  of 0.65 mg/dL). Liver Function Tests: Recent Labs  Lab 08/12/21 0030  AST 36  ALT 85*  ALKPHOS 69  BILITOT 0.4  PROT 8.1  ALBUMIN 3.3*   No results for input(s): LIPASE, AMYLASE in the last 168 hours. No results for input(s): AMMONIA in the last 168 hours. Coagulation Profile: No results for input(s): INR, PROTIME in the last 168 hours. Cardiac Enzymes: No results for input(s): CKTOTAL, CKMB, CKMBINDEX, TROPONINI in the last 168 hours. BNP (last 3 results) No results for input(s): PROBNP in the last 8760 hours. HbA1C: No results for input(s): HGBA1C in the last 72 hours. CBG: No results for input(s): GLUCAP in the last 168 hours. Lipid Profile: No results for input(s): CHOL, HDL, LDLCALC, TRIG, CHOLHDL, LDLDIRECT in the last 72 hours. Thyroid Function Tests: No results for input(s): TSH, T4TOTAL, FREET4, T3FREE, THYROIDAB in the last 72 hours. Anemia Panel: No results for input(s): VITAMINB12, FOLATE, FERRITIN, TIBC, IRON, RETICCTPCT in the last 72 hours. Sepsis Labs: Recent Labs  Lab 08/12/21 0030  LATICACIDVEN 1.7    Recent Results (from the past 240 hour(s))  Resp Panel by RT-PCR (Flu A&B, Covid) Nasopharyngeal Swab     Status: None   Collection Time: 08/12/21 12:00 AM   Specimen: Nasopharyngeal Swab; Nasopharyngeal(NP) swabs in vial transport medium  Result Value Ref Range Status   SARS Coronavirus 2 by RT PCR NEGATIVE NEGATIVE Final    Comment: (NOTE) SARS-CoV-2 target nucleic acids are NOT DETECTED.  The SARS-CoV-2 RNA is generally detectable in upper respiratory specimens during the acute phase of infection. The lowest concentration of SARS-CoV-2 viral copies this assay can detect is 138 copies/mL. A negative result does not preclude SARS-Cov-2 infection and should not be used as the sole basis for treatment or other patient management decisions. A negative result may occur with  improper specimen collection/handling, submission of specimen other than  nasopharyngeal swab, presence of viral mutation(s) within the areas targeted by this assay, and inadequate number of viral copies(<138 copies/mL). A negative result must be combined with clinical observations, patient history, and epidemiological information. The expected result is Negative.  Fact Sheet for Patients:  EntrepreneurPulse.com.au  Fact Sheet for Healthcare Providers:  IncredibleEmployment.be  This test is no t yet approved or cleared by the [place] FDA and  has been authorized for detection and/or diagnosis of SARS-CoV-2 by FDA under an Emergency Use Authorization (EUA). This EUA will remain  in effect (meaning this test can be used) for the duration of the COVID-19 declaration under Section 564(b)(1) of the Act, 21 U.S.C.section 360bbb-3(b)(1), unless the authorization is terminated  or revoked sooner.       Influenza A by PCR NEGATIVE NEGATIVE Final   Influenza B by PCR NEGATIVE NEGATIVE Final    Comment: (NOTE) The Xpert Xpress SARS-CoV-2/FLU/RSV plus assay is intended as an aid in the diagnosis of influenza from Nasopharyngeal swab  specimens and should not be used as a sole basis for treatment. Nasal washings and aspirates are unacceptable for Xpert Xpress SARS-CoV-2/FLU/RSV testing.  Fact Sheet for Patients: EntrepreneurPulse.com.au  Fact Sheet for Healthcare Providers: IncredibleEmployment.be  This test is not yet approved or cleared by the [place] FDA and has been authorized for detection and/or diagnosis of SARS-CoV-2 by FDA under an Emergency Use Authorization (EUA). This EUA will remain in effect (meaning this test can be used) for the duration of the COVID-19 declaration under Section 564(b)(1) of the Act, 21 U.S.C. section 360bbb-3(b)(1), unless the authorization is terminated or revoked.  Performed at [facility], [place] [address], [place], [place] [address]   Aerobic/Anaerobic Culture w Gram Stain (surgical/deep wound)     Status: None (Preliminary result)   Collection Time: 08/12/21  4:05 AM   Specimen: Abscess  Result Value Ref Range Status   Specimen Description   Final    ABSCESS LEFT KIDNEY Performed at [place] [address], [place], [place] [address]    Special Requests   Final    NONE Performed at [facility], [place] [address], [place], [place] [address]    Gram Stain   Final    NO SQUAMOUS EPITHELIAL CELLS SEEN FEW WBC SEEN NO ORGANISMS SEEN Performed at [facility], [place] [address], [place], [place] [address]    Culture PENDING  Incomplete   Report Status PENDING  Incomplete         Radiology Studies: CT ABDOMEN PELVIS W WO CONTRAST  Result Date: 08/11/2021 CLINICAL DATA:  History of known kidney infection EXAM: CT ANGIOGRAPHY CHEST CT ABDOMEN AND PELVIS WITH CONTRAST TECHNIQUE: Multidetector CT imaging of the chest was performed using the standard protocol during bolus administration of intravenous contrast. Multiplanar CT image reconstructions and MIPs were obtained to evaluate the vascular anatomy. Multidetector CT imaging of the abdomen and pelvis was performed using the standard protocol during bolus administration of intravenous contrast. CONTRAST:  129mL OMNIPAQUE IOHEXOL 350 MG/ML SOLN COMPARISON:  08/10/2021 noncontrast CT of the abdomen FINDINGS: CTA CHEST FINDINGS Cardiovascular: Thoracic aorta and its branches are within normal limits. No aneurysmal dilatation or dissection is noted. Heart is mildly enlarged in size. The pulmonary artery is well visualized within normal branching pattern. No focal filling defect to suggest pulmonary embolism is identified. Mediastinum/Nodes: Thoracic inlet is within normal limits. Esophagus as visualized is unremarkable. No sizable hilar or mediastinal adenopathy is noted. Lungs/Pleura: Lungs are well  aerated bilaterally and demonstrate mild bibasilar atelectasis. Minimal emphysematous changes are seen. No focal infiltrate or sizable effusion is seen. No sizable parenchymal nodules are noted. Musculoskeletal: Degenerative changes of the thoracic spine are noted. No acute rib abnormality is noted. Review of the MIP images confirms the above findings. CT ABDOMEN and PELVIS FINDINGS Hepatobiliary: No focal liver abnormality is seen. No gallstones, gallbladder wall thickening, or biliary dilatation. Pancreas: Unremarkable. No pancreatic ductal dilatation or surrounding inflammatory changes. Spleen: Normal in size without focal abnormality. Adrenals/Urinary Tract: Adrenal glands are within normal limits. Kidneys demonstrate a normal enhancement pattern on the right. Left kidney demonstrates a large irregular fluid attenuation collection in the upper pole measuring 4.3 x 4.0 cm in greatest AP and transverse dimensions. It extends for approximately 3.6 cm in craniocaudad projection. Delayed images show multiple areas of decreased enhancement consistent with pyelonephritis. A small subcapsular fluid collection is noted in the lower pole medially likely of similar etiology. No obstructive changes are  seen. No ureteral stones are noted. The bladder is decompressed. Tiny nonobstructing lower pole stone is noted on the right. This is stable from the most recent exam. Stomach/Bowel: No obstructive changes in the small bowel are noted. Stomach is within normal limits. Colon shows scattered diverticular change without evidence of diverticulitis. No obstructive changes are seen. The appendix is within normal limits. Vascular/Lymphatic: Aortic atherosclerosis. No enlarged abdominal or pelvic lymph nodes. Reproductive: Uterine fibroids are seen. Largest of these measures 3.7 cm. No adnexal mass is noted. Other: No abdominal wall hernia or abnormality. No abdominopelvic ascites. Musculoskeletal: No acute or significant osseous  findings. Review of the MIP images confirms the above findings. IMPRESSION: CTA of the chest: No evidence of acute pulmonary emboli. Mild emphysematous changes without acute infiltrate. CT of the abdomen and pelvis: In the upper pole of the left kidney, there is an irregular fluid collection identified consistent with focal renal abscess. This is similar to that seen on prior exam but better delineated due to the contrast enhancement. Additionally a small subcapsular fluid collection is noted inferiorly as well as multiple areas of decreased enhancement on delayed images. These changes are consistent with pyelonephritis. Uterine fibroid disease. Diverticulosis without diverticulitis. Tiny nonobstructing right renal stone stable from the previous day. Critical Value/emergent results were called by telephone at the time of interpretation on 08/11/2021 at 10:33 pm to Dr. Alyson Ingles, Who verbally acknowledged these results. Electronically Signed   By: Inez Catalina M.D.   On: 08/11/2021 22:36   CT Angio Chest Pulmonary Embolism (PE) W or WO Contrast  Result Date: 08/11/2021 CLINICAL DATA:  History of known kidney infection EXAM: CT ANGIOGRAPHY CHEST CT ABDOMEN AND PELVIS WITH CONTRAST TECHNIQUE: Multidetector CT imaging of the chest was performed using the standard protocol during bolus administration of intravenous contrast. Multiplanar CT image reconstructions and MIPs were obtained to evaluate the vascular anatomy. Multidetector CT imaging of the abdomen and pelvis was performed using the standard protocol during bolus administration of intravenous contrast. CONTRAST:  121mL OMNIPAQUE IOHEXOL 350 MG/ML SOLN COMPARISON:  08/10/2021 noncontrast CT of the abdomen FINDINGS: CTA CHEST FINDINGS Cardiovascular: Thoracic aorta and its branches are within normal limits. No aneurysmal dilatation or dissection is noted. Heart is mildly enlarged in size. The pulmonary artery is well visualized within normal branching pattern.  No focal filling defect to suggest pulmonary embolism is identified. Mediastinum/Nodes: Thoracic inlet is within normal limits. Esophagus as visualized is unremarkable. No sizable hilar or mediastinal adenopathy is noted. Lungs/Pleura: Lungs are well aerated bilaterally and demonstrate mild bibasilar atelectasis. Minimal emphysematous changes are seen. No focal infiltrate or sizable effusion is seen. No sizable parenchymal nodules are noted. Musculoskeletal: Degenerative changes of the thoracic spine are noted. No acute rib abnormality is noted. Review of the MIP images confirms the above findings. CT ABDOMEN and PELVIS FINDINGS Hepatobiliary: No focal liver abnormality is seen. No gallstones, gallbladder wall thickening, or biliary dilatation. Pancreas: Unremarkable. No pancreatic ductal dilatation or surrounding inflammatory changes. Spleen: Normal in size without focal abnormality. Adrenals/Urinary Tract: Adrenal glands are within normal limits. Kidneys demonstrate a normal enhancement pattern on the right. Left kidney demonstrates a large irregular fluid attenuation collection in the upper pole measuring 4.3 x 4.0 cm in greatest AP and transverse dimensions. It extends for approximately 3.6 cm in craniocaudad projection. Delayed images show multiple areas of decreased enhancement consistent with pyelonephritis. A small subcapsular fluid collection is noted in the lower pole medially likely of similar etiology. No obstructive changes are seen.  No ureteral stones are noted. The bladder is decompressed. Tiny nonobstructing lower pole stone is noted on the right. This is stable from the most recent exam. Stomach/Bowel: No obstructive changes in the small bowel are noted. Stomach is within normal limits. Colon shows scattered diverticular change without evidence of diverticulitis. No obstructive changes are seen. The appendix is within normal limits. Vascular/Lymphatic: Aortic atherosclerosis. No enlarged abdominal  or pelvic lymph nodes. Reproductive: Uterine fibroids are seen. Largest of these measures 3.7 cm. No adnexal mass is noted. Other: No abdominal wall hernia or abnormality. No abdominopelvic ascites. Musculoskeletal: No acute or significant osseous findings. Review of the MIP images confirms the above findings. IMPRESSION: CTA of the chest: No evidence of acute pulmonary emboli. Mild emphysematous changes without acute infiltrate. CT of the abdomen and pelvis: In the upper pole of the left kidney, there is an irregular fluid collection identified consistent with focal renal abscess. This is similar to that seen on prior exam but better delineated due to the contrast enhancement. Additionally a small subcapsular fluid collection is noted inferiorly as well as multiple areas of decreased enhancement on delayed images. These changes are consistent with pyelonephritis. Uterine fibroid disease. Diverticulosis without diverticulitis. Tiny nonobstructing right renal stone stable from the previous day. Critical Value/emergent results were called by telephone at the time of interpretation on 08/11/2021 at 10:33 pm to Dr. Alyson Ingles, Who verbally acknowledged these results. Electronically Signed   By: Inez Catalina M.D.   On: 08/11/2021 22:36        Scheduled Meds:  levothyroxine  112 mcg Oral QAC breakfast   sodium chloride flush  5 mL Intracatheter Q8H   venlafaxine XR  150 mg Oral BID   Continuous Infusions:  cefTRIAXone (ROCEPHIN)  IV 2 g (08/13/21 0143)   lactated ringers 75 mL/hr at 08/13/21 0605     LOS: 1 day    Time spent: More than 50% of that time was spent in counseling and/or coordination of care.      Shelly Coss, MD [facility] P11/20/2022, 8:01 AM

## 2021-08-14 LAB — CBC WITH DIFFERENTIAL/PLATELET
Abs Immature Granulocytes: 0.08 10*3/uL — ABNORMAL HIGH (ref 0.00–0.07)
Basophils Absolute: 0.1 10*3/uL (ref 0.0–0.1)
Basophils Relative: 1 %
Eosinophils Absolute: 0.1 10*3/uL (ref 0.0–0.5)
Eosinophils Relative: 1 %
HCT: 35.5 % — ABNORMAL LOW (ref 36.0–46.0)
Hemoglobin: 11.1 g/dL — ABNORMAL LOW (ref 12.0–15.0)
Immature Granulocytes: 1 %
Lymphocytes Relative: 12 %
Lymphs Abs: 1.2 10*3/uL (ref 0.7–4.0)
MCH: 27.1 pg (ref 26.0–34.0)
MCHC: 31.3 g/dL (ref 30.0–36.0)
MCV: 86.6 fL (ref 80.0–100.0)
Monocytes Absolute: 0.7 10*3/uL (ref 0.1–1.0)
Monocytes Relative: 7 %
Neutro Abs: 8.2 10*3/uL — ABNORMAL HIGH (ref 1.7–7.7)
Neutrophils Relative %: 78 %
Platelets: 505 10*3/uL — ABNORMAL HIGH (ref 150–400)
RBC: 4.1 MIL/uL (ref 3.87–5.11)
RDW: 13.1 % (ref 11.5–15.5)
WBC: 10.3 10*3/uL (ref 4.0–10.5)
nRBC: 0 % (ref 0.0–0.2)

## 2021-08-14 LAB — BASIC METABOLIC PANEL
Anion gap: 10 (ref 5–15)
BUN: 7 mg/dL (ref 6–20)
CO2: 23 mmol/L (ref 22–32)
Calcium: 8.3 mg/dL — ABNORMAL LOW (ref 8.9–10.3)
Chloride: 103 mmol/L (ref 98–111)
Creatinine, Ser: 0.56 mg/dL (ref 0.44–1.00)
GFR, Estimated: >60 mL/min (ref >60)
Glucose, Bld: 105 mg/dL — ABNORMAL HIGH (ref 70–99)
Potassium: 3.5 mmol/L (ref 3.5–5.1)
Sodium: 136 mmol/L (ref 135–145)

## 2021-08-14 MED ORDER — OXYCODONE HCL 5 MG PO TABS
5.0000 mg | ORAL_TABLET | Freq: Four times a day (QID) | ORAL | Status: DC | PRN
  Administered 2021-08-14 – 2021-08-15 (×4): 5 mg via ORAL
  Filled 2021-08-14 (×5): qty 1

## 2021-08-14 MED ORDER — SODIUM CHLORIDE 0.9 % IV SOLN
INTRAVENOUS | Status: DC
Start: 2021-08-14 — End: 2021-08-15

## 2021-08-14 MED ORDER — HYDROCODONE-ACETAMINOPHEN 5-325 MG PO TABS: 1.0000 | ORAL_TABLET | Freq: Four times a day (QID) | ORAL | 0 refills | Status: DC | PRN

## 2021-08-14 MED ORDER — ONDANSETRON HCL 4 MG PO TABS: 4.0000 mg | ORAL_TABLET | Freq: Four times a day (QID) | ORAL | 0 refills | Status: AC | PRN

## 2021-08-14 NOTE — Progress Notes (Signed)
Subjective: [name] is doing better following placement of the left NT and initiation of Zosyn.   She has pain from the nephrostomy tube.   The abscess fluid culture is showing klebsiella which is consistent with the culture from my office on 08/02/21.   Tmax is 98.5.  ROS:  Review of Systems  Constitutional:  Negative for fever.  Genitourinary:  Positive for flank pain.   Anti-infectives: Anti-infectives (From admission, onward)    Start     Dose/Rate Route Frequency Ordered Stop   08/13/21 0900  piperacillin-tazobactam (ZOSYN) IVPB 3.375 g        3.375 g 12.5 mL/hr over 240 Minutes Intravenous Every 8 hours 08/13/21 0823     08/12/21 0130  cefTRIAXone (ROCEPHIN) 2 g in sodium chloride 0.9 % 100 mL IVPB  Status:  Discontinued        2 g 200 mL/hr over 30 Minutes Intravenous Every 24 hours 08/12/21 0118 08/13/21 0801       Current Facility-Administered Medications  Medication Dose Route Frequency Provider Last Rate Last Admin   0.9 %  sodium chloride infusion   Intravenous Continuous Shelly Coss, MD 10 mL/hr at 08/14/21 1017 New Bag at 08/14/21 1017   acetaminophen (TYLENOL) tablet 650 mg  650 mg Oral Q6H PRN Etta Quill, DO   650 mg at 08/13/21 2122   Or   acetaminophen (TYLENOL) suppository 650 mg  650 mg Rectal Q6H PRN Etta Quill, DO       levothyroxine (SYNTHROID) tablet 112 mcg  112 mcg Oral QAC breakfast Shelly Coss, MD   112 mcg at 08/14/21 0515   morphine 2 MG/ML injection 2 mg  2 mg Intravenous Q4H PRN Shelly Coss, MD   2 mg at 08/14/21 0817   ondansetron (ZOFRAN) tablet 4 mg  4 mg Oral Q6H PRN Etta Quill, DO       Or   ondansetron [facility]) injection 4 mg  4 mg Intravenous Q6H PRN Etta Quill, DO       oxyCODONE (Oxy IR/ROXICODONE) immediate release tablet 5 mg  5 mg Oral Q6H PRN Shelly Coss, MD   5 mg at 08/14/21 1159   piperacillin-tazobactam (ZOSYN) IVPB 3.375 g  3.375 g Intravenous Q8H Pham, Anh P, RPH 12.5 mL/hr at 08/14/21 1701  3.375 g at 08/14/21 1701   sodium chloride flush (NS) 0.9 % injection 5 mL  5 mL Intracatheter Q8H Corrie Mckusick, DO   5 mL at 08/14/21 1400   venlafaxine XR (EFFEXOR-XR) 24 hr capsule 150 mg  150 mg Oral BID Shelly Coss, MD   150 mg at 08/14/21 1053     Objective: Vital signs in last 24 hours: Temp:  [97.8 F (36.6 C)-98.5 F (36.9 C)] 98 F (36.7 C) (11/21 1517) Pulse Rate:  [73-106] 90 (11/21 1517) Resp:  [18] 18 (11/21 1517) BP: (121-132)/(71-77) 132/77 (11/21 1517) SpO2:  [92 %-98 %] 98 % (11/21 1517)  Intake/Output from previous day: 11/20 0701 - 11/21 0700 In: 3481.9 [P.O.:2430; I.V.:918.2; IV Piggyback:123.7] Out: 5200 [Urine:5100; Drains:100] Intake/Output this shift: Total I/O In: 982 [P.O.:720; I.V.:189.2; IV Piggyback:72.8] Out: 1610 [Urine:1700; Drains:20]   Physical Exam Vitals reviewed.  Constitutional:      Appearance: Normal appearance.  Abdominal:     Comments: The JP bulb has only a small amount of serosanguinous fluid.   Neurological:     Mental Status: She is alert.    Lab Results:  Recent Labs    08/13/21 0433 08/14/21 0420  WBC 19.6* 10.3  HGB 11.1* 11.1*  HCT 34.6* 35.5*  PLT 575* 505*   BMET Recent Labs    08/13/21 0433 08/14/21 0420  NA 136 136  K 4.2 3.5  CL 102 103  CO2 25 23  GLUCOSE 117* 105*  BUN 11 7  CREATININE 0.65 0.56  CALCIUM 8.4* 8.3*   PT/INR No results for input(s): LABPROT, INR in the last 72 hours. ABG No results for input(s): PHART, HCO3 in the last 72 hours.  Invalid input(s): PCO2, PO2  Studies/Results: No results found.   Assessment and Plan: Left renal abscess following removal of a stent that was placed for stenting for a left distal stone with sepsis.  She is doing better after placement of the left perc drain.   She has Klebsiella on the abscess culture which is consistent with her initial culture on 08/02/21.   Consider having IR remove tube either tomorrow s/p discharge.  I will arrange f/u  in my office.   Chronic cystitis.   She will need to remain on suppressive therapy for 3 months following completion of therapeutic course.     LOS: 2 days    [name] 08/14/2021 [phone] Patient ID: Jacqueline Finley, female   DOB: 04/15/1966, 55 y.o.   MRN: [id number]

## 2021-08-14 NOTE — Discharge Instructions (Addendum)
Flush catheter drain with 5-10cc saline 2x per day May change dressing every 2-3 days or sooner if soiled No driving for 24 hours or after narcotics The clinic will call you for your 10-14 day follow up appointment You may call [phone] with questions/concerns Record daily output and bring to appointment.  A prescription for bactrim to take 1 nightly was sent to your pharmacy to begin after you complete the antibiotics for the abscess.  The nightly antibiotic is to prevent recurrence of the infection since your bladder had evidence of chronic infection.  I will want you on that for 3 months.

## 2021-08-14 NOTE — Progress Notes (Signed)
PROGRESS NOTE    Jacqueline Finley  IRJ:[id number] DOB: 26-Oct-1965 DOA: 08/11/2021 PCP: Inda Coke, PA   Chief Complain: Left flank pain, fever  Brief Narrative: Patient is a 55 year old female with history of hypertension, infected left ureteral stone was recently admitted here earlier this month and was treated with a stent placement and was discharged with oral antibiotics came back with continuous low-grade fever, left flank pain at home.  CT abdomen/pelvis done as ordered by urology showed left renal abscess and she was sent to the emergency room for admission.  IR consulted and she underwent drainage on 08/12/21.  Urology  were following.  Plan for discharge tomorrow to home.  Assessment & Plan:   Principal Problem:   Renal abscess, left Active Problems:   Hypertension   Klebsiella pneumoniae infection   Sepsis secondary to left renal abscess:history of hypertension, infected left ureteral stone was recently admitted here earlier this month and was treated with a stent placement and was discharged with oral antibiotics came back with continuous low-grade fever, left flank pain at home.  CT abdomen/pelvis done as ordered by urology showed left renal abscess and she was sent to the emergency room for admission.   IR consulted and she underwent drainage on 08/12/21.  Urology  were following.   Continue pain management, supportive care.Leukocytosis improved today.  Currently on Zosyn.  Anaerobic/aerobic cultures showing Klebsiella pneumonia, will follow up susceptibility.  Hypertension: Currently blood pressure stable.  Continue monitoring.  Takes hydrochlorothiazide at home.  History of hypothyroidism: Continue Synthyroid        DVT prophylaxis:SCD Code Status: Full Family Communication: None at bedside Status is: Inpatient      Consultants: Urology, IR  Procedures:  Antimicrobials:  Anti-infectives (From admission, onward)    Start     Dose/Rate Route Frequency  Ordered Stop   08/13/21 0900  piperacillin-tazobactam (ZOSYN) IVPB 3.375 g        3.375 g 12.5 mL/hr over 240 Minutes Intravenous Every 8 hours 08/13/21 0823     08/12/21 0130  cefTRIAXone (ROCEPHIN) 2 g in sodium chloride 0.9 % 100 mL IVPB  Status:  Discontinued        2 g 200 mL/hr over 30 Minutes Intravenous Every 24 hours 08/12/21 0118 08/13/21 0801       Subjective:  Patient seen and examined the bedside this morning.  Hemodynamically stable.  Complains of persistent pain on the drainage site.   Objective: Vitals:   08/13/21 1004 08/13/21 1419 08/13/21 2115 08/14/21 0521  BP: 138/79 135/73 121/74 121/71  Pulse: (!) 105 95 (!) 106 73  Resp: 16 18 18 18   Temp: 99.2 F (37.3 C) 97.8 F (36.6 C) 98.5 F (36.9 C) 97.8 F (36.6 C)  TempSrc: Oral Oral Oral Oral  SpO2: 94% 97% 92% 97%  Weight:      Height:        Intake/Output Summary (Last 24 hours) at 08/14/2021 1144 Last data filed at 08/14/2021 1000 Gross per 24 hour  Intake 3039.8 ml  Output 4480 ml  Net -1440.2 ml   Filed Weights   08/12/21 1815  Weight: 76.4 kg    Examination:  General exam: Overall comfortable, not in distress HEENT: PERRL Respiratory system:  no wheezes or crackles  Cardiovascular system: S1 & S2 heard, RRR.  Gastrointestinal system: Abdomen is nondistended, soft and nontender.  Pigtail catheter on the left costophrenic angle Central nervous system: Alert and oriented Extremities: No edema, no clubbing ,no cyanosis Skin: No rashes,  no ulcers,no icterus    Data Reviewed: I have personally reviewed following labs and imaging studies  CBC: Recent Labs  Lab 08/12/21 0030 08/12/21 0225 08/13/21 0433 08/14/21 0420  WBC 10.9* 10.7* 19.6* 10.3  NEUTROABS 7.8*  --  18.0* 8.2*  HGB 12.1 11.3* 11.1* 11.1*  HCT 38.3 35.0* 34.6* 35.5*  MCV 85.9 84.5 85.6 86.6  PLT 694* 646* 575* 127*   Basic Metabolic Panel: Recent Labs  Lab 08/12/21 0030 08/12/21 0225 08/13/21 0433 08/14/21 0420   NA 134* 137 136 136  K 3.8 3.7 4.2 3.5  CL 101 105 102 103  CO2 24 23 25 23   GLUCOSE 100* 102* 117* 105*  BUN 12 11 11 7   CREATININE 0.56 0.47 0.65 0.56  CALCIUM 8.7* 8.5* 8.4* 8.3*   GFR: Estimated Creatinine Clearance: 77.7 mL/min (by C-G formula based on SCr of 0.56 mg/dL). Liver Function Tests: Recent Labs  Lab 08/12/21 0030  AST 36  ALT 85*  ALKPHOS 69  BILITOT 0.4  PROT 8.1  ALBUMIN 3.3*   No results for input(s): LIPASE, AMYLASE in the last 168 hours. No results for input(s): AMMONIA in the last 168 hours. Coagulation Profile: No results for input(s): INR, PROTIME in the last 168 hours. Cardiac Enzymes: No results for input(s): CKTOTAL, CKMB, CKMBINDEX, TROPONINI in the last 168 hours. BNP (last 3 results) No results for input(s): PROBNP in the last 8760 hours. HbA1C: No results for input(s): HGBA1C in the last 72 hours. CBG: No results for input(s): GLUCAP in the last 168 hours. Lipid Profile: No results for input(s): CHOL, HDL, LDLCALC, TRIG, CHOLHDL, LDLDIRECT in the last 72 hours. Thyroid Function Tests: No results for input(s): TSH, T4TOTAL, FREET4, T3FREE, THYROIDAB in the last 72 hours. Anemia Panel: No results for input(s): VITAMINB12, FOLATE, FERRITIN, TIBC, IRON, RETICCTPCT in the last 72 hours. Sepsis Labs: Recent Labs  Lab 08/12/21 0030  LATICACIDVEN 1.7    Recent Results (from the past 240 hour(s))  Resp Panel by RT-PCR (Flu A&B, Covid) Nasopharyngeal Swab     Status: None   Collection Time: 08/12/21 12:00 AM   Specimen: Nasopharyngeal Swab; Nasopharyngeal(NP) swabs in vial transport medium  Result Value Ref Range Status   SARS Coronavirus 2 by RT PCR NEGATIVE NEGATIVE Final    Comment: (NOTE) SARS-CoV-2 target nucleic acids are NOT DETECTED.  The SARS-CoV-2 RNA is generally detectable in upper respiratory specimens during the acute phase of infection. The lowest concentration of SARS-CoV-2 viral copies this assay can detect is 138  copies/mL. A negative result does not preclude SARS-Cov-2 infection and should not be used as the sole basis for treatment or other patient management decisions. A negative result may occur with  improper specimen collection/handling, submission of specimen other than nasopharyngeal swab, presence of viral mutation(s) within the areas targeted by this assay, and inadequate number of viral copies(<138 copies/mL). A negative result must be combined with clinical observations, patient history, and epidemiological information. The expected result is Negative.  Fact Sheet for Patients:  EntrepreneurPulse.com.au  Fact Sheet for Healthcare Providers:  IncredibleEmployment.be  This test is no t yet approved or cleared by the [place] FDA and  has been authorized for detection and/or diagnosis of SARS-CoV-2 by FDA under an Emergency Use Authorization (EUA). This EUA will remain  in effect (meaning this test can be used) for the duration of the COVID-19 declaration under Section 564(b)(1) of the Act, 21 U.S.C.section 360bbb-3(b)(1), unless the authorization is terminated  or revoked sooner.  Influenza A by PCR NEGATIVE NEGATIVE Final   Influenza B by PCR NEGATIVE NEGATIVE Final    Comment: (NOTE) The Xpert Xpress SARS-CoV-2/FLU/RSV plus assay is intended as an aid in the diagnosis of influenza from Nasopharyngeal swab specimens and should not be used as a sole basis for treatment. Nasal washings and aspirates are unacceptable for Xpert Xpress SARS-CoV-2/FLU/RSV testing.  Fact Sheet for Patients: EntrepreneurPulse.com.au  Fact Sheet for Healthcare Providers: IncredibleEmployment.be  This test is not yet approved or cleared by the [place] FDA and has been authorized for detection and/or diagnosis of SARS-CoV-2 by FDA under an Emergency Use Authorization (EUA). This EUA will remain in effect (meaning  this test can be used) for the duration of the COVID-19 declaration under Section 564(b)(1) of the Act, 21 U.S.C. section 360bbb-3(b)(1), unless the authorization is terminated or revoked.  Performed at [facility], [place] [address], [place], [place] [address]   Blood culture (routine x 2)     Status: None (Preliminary result)   Collection Time: 08/12/21 12:30 AM   Specimen: BLOOD  Result Value Ref Range Status   Specimen Description   Final    BLOOD BLOOD RIGHT FOREARM Performed at [place] [address], [place], [place] [address]    Special Requests   Final    BOTTLES DRAWN AEROBIC AND ANAEROBIC Blood Culture adequate volume Performed at [place] [address] [place], [place] [address]    Culture   Final    NO GROWTH 1 DAY Performed at [facility], [place] [address] [place], [place] [address]    Report Status PENDING  Incomplete  Urine Culture     Status: None   Collection Time: 08/12/21 12:30 AM   Specimen: Urine, Clean Catch  Result Value Ref Range Status   Specimen Description   Final    URINE, CLEAN CATCH Performed at [facility], [place] [address] [place], [place] [address]    Special Requests   Final    NONE Performed at [facility], [place] [address], [place], [place] [address]    Culture   Final    NO GROWTH Performed at [facility], [place] [address], [place], [place] [address]    Report Status 08/13/2021 FINAL  Final  Blood culture (routine x 2)     Status: None (Preliminary result)   Collection Time: 08/12/21 12:33 AM   Specimen: BLOOD  Result Value Ref Range Status   Specimen Description   Final    BLOOD LEFT ANTECUBITAL Performed at [place] [address] [place], [place] [address]    Special Requests   Final    BOTTLES DRAWN AEROBIC AND ANAEROBIC Blood Culture results may not be optimal due to an inadequate  volume of blood received in culture bottles Performed at [place] [address], [place], [place] [address]    Culture   Final    NO GROWTH 1 DAY Performed at [facility], [place] [address], [place], [place] [address]    Report Status PENDING  Incomplete  Aerobic/Anaerobic Culture w Gram Stain (surgical/deep wound)     Status: None (Preliminary result)   Collection Time: 08/12/21  4:05 AM   Specimen: Abscess  Result Value Ref Range Status   Specimen Description   Final    ABSCESS LEFT KIDNEY Performed at [place] [address], [place],  [address]  Special Requests   Final    NONE Performed at [facility], [place] [address], [place], [place] [address]    Gram Stain   Final    NO SQUAMOUS EPITHELIAL CELLS SEEN FEW WBC SEEN NO ORGANISMS SEEN Performed at [facility], [place] [address], [place], [place] [address]    Culture   Final    FEW KLEBSIELLA PNEUMONIAE SUSCEPTIBILITIES TO FOLLOW NO ANAEROBES ISOLATED; CULTURE IN PROGRESS FOR 5 DAYS    Report Status PENDING  Incomplete         Radiology Studies: CT IMAGE GUIDED DRAINAGE BY PERCUTANEOUS CATHETER  Result Date: 08/13/2021 INDICATION: 55 year old female with a left renal abscess referred for drainage EXAM: CT GUIDED DRAINAGE OF  ABSCESS MEDICATIONS: The patient is currently admitted to the hospital and receiving intravenous antibiotics. The antibiotics were administered within an appropriate time frame prior to the initiation of the procedure. ANESTHESIA/SEDATION: 2.0 mg IV Versed 100 mcg IV Fentanyl Moderate Sedation Time:  16 minutes The patient was continuously monitored during the procedure by the interventional radiology nurse under my direct supervision. COMPLICATIONS: None TECHNIQUE: Informed written consent was obtained from the patient after a thorough discussion of the procedural risks, benefits and alternatives. All questions were  addressed. Maximal Sterile Barrier Technique was utilized including caps, mask, sterile gowns, sterile gloves, sterile drape, hand hygiene and skin antiseptic. A timeout was performed prior to the initiation of the procedure. PROCEDURE: Patient was position prone position on the CT gantry table and a scout CT of the upper abdomen was performed. The left flank was prepped with chlorhexidine in a sterile fashion, and a sterile drape was applied covering the operative field. A sterile gown and sterile gloves were used for the procedure. Local anesthesia was provided with 1% Lidocaine. Once the patient is prepped and draped in the usual sterile fashion and the skin was anesthetized overlying the left kidney, a Yueh needle was advanced with CT guidance from just adjacent to the left twelfth rib, directed in a cephalad trajectory, in an effort to avoid the lowest margin of the diaphragm. Given the position of the abscess at the superior pole of the left kidney, this cephalad trajectory was necessary. Once complex fluid was aspirated from the Yueh needle, modified Seldinger technique was used to place a 12 [place] drain into the abscess. Aspiration of purulent material was sent for culture. The drain was attached to bulb suction. Drain was sutured in position. Patient tolerated the procedure well and remained hemodynamically stable throughout. No complications were encountered and no significant blood loss. FINDINGS: Twelve French drain into the superior position abscess within the left renal cortex. IMPRESSION: Status post CT-guided drainage of left renal abscess at the superior pole. Signed, Dulcy Fanny. Dellia Nims, RPVI [facility] Radiology Electronically Signed   By: Corrie Mckusick D.O.   On: 08/13/2021 08:28        Scheduled Meds:  levothyroxine  112 mcg Oral QAC breakfast   sodium chloride flush  5 mL Intracatheter Q8H   venlafaxine XR  150 mg Oral BID    Continuous Infusions:  sodium chloride 10 mL/hr at 08/14/21 1017   lactated ringers 75 mL/hr at 08/14/21 0518   piperacillin-tazobactam (ZOSYN)  IV 3.375 g (08/14/21 1017)     LOS: 2 days    Time spent: 25 mins.More than 50% of that time was spent in counseling and/or coordination of care.      Shelly Coss, MD [facility] P11/21/2022, 11:44 AM

## 2021-08-14 NOTE — Progress Notes (Signed)
Supervising Physician: Ruthann Cancer  Patient Status:  [name]  Chief Complaint:  Renal abscess  Subjective:  Reports feeling much better today and looking forward to going home tomorrow.  Allergies: Morphine and Scopolamine  Medications: Prior to Admission medications   Medication Sig Start Date End Date Taking? Authorizing Provider  bimatoprost (LATISSE) 0.03 % ophthalmic solution Place 1 application into both eyes See admin instructions. Apply topically along upper eyelid margin at base of eyelashes daily at bedtime 06/09/21  Yes [provider]  hydrochlorothiazide (HYDRODIURIL) 25 MG tablet TAKE 1 TABLET BY MOUTH EVERY DAY IN THE MORNING Patient taking differently: Take 25 mg by mouth every morning. 06/30/21  Yes Worley, Samantha, PA  SYNTHROID 112 MCG tablet TAKE 1 TABLET BY MOUTH EVERY MORNING ON AN EMPTY STOMACH WITH A GLASS OF WATER AT LEAST 30 TO 60 MINUTES BEFORE BREAKFAST Patient taking differently: Take 112 mcg by mouth daily before breakfast. 06/30/21  Yes Inda Coke, PA  venlafaxine XR (EFFEXOR-XR) 150 MG 24 hr capsule Take 1 capsule (150 mg total) by mouth 2 (two) times daily. Patient taking differently: Take 300 mg by mouth every morning. 06/30/21 09/28/21 Yes Worley, Aldona Bar, PA     Vital Signs: BP 132/77 (BP Location: Left Arm)   Pulse 90   Temp 98 F (36.7 C) (Oral)   Resp 18   Ht 5\' 4"  (1.626 m)   Wt 168 lb 6.9 oz (76.4 kg)   SpO2 98%   BMI 28.91 kg/m   Drain Location: CVA left Size: Fr size: 12 Fr Date of placement: 08/12/21  Currently to: Drain collection device: suction bulb 24 hour output:  Output by Drain (mL) 08/12/21 0701 - 08/12/21 1900 08/12/21 1901 - 08/13/21 0700 08/13/21 0701 - 08/13/21 1900 08/13/21 1901 - 08/14/21 0700 08/14/21 0701 - 08/14/21 1637  Closed System Drain Left Flank Bulb (JP) 12 Fr. 10 100 50 50 20    Current examination: Flushes/aspirates easily.  Insertion site unremarkable. Suture and stat lock  in place. Dressed appropriately.  Current blood tinged ouput, clear, approx 5cc in bulb   Imaging: CT ABDOMEN PELVIS W WO CONTRAST  Result Date: 08/11/2021 CLINICAL DATA:  History of known kidney infection EXAM: CT ANGIOGRAPHY CHEST CT ABDOMEN AND PELVIS WITH CONTRAST TECHNIQUE: Multidetector CT imaging of the chest was performed using the standard protocol during bolus administration of intravenous contrast. Multiplanar CT image reconstructions and MIPs were obtained to evaluate the vascular anatomy. Multidetector CT imaging of the abdomen and pelvis was performed using the standard protocol during bolus administration of intravenous contrast. CONTRAST:  141mL OMNIPAQUE IOHEXOL 350 MG/ML SOLN COMPARISON:  08/10/2021 noncontrast CT of the abdomen FINDINGS: CTA CHEST FINDINGS Cardiovascular: Thoracic aorta and its branches are within normal limits. No aneurysmal dilatation or dissection is noted. Heart is mildly enlarged in size. The pulmonary artery is well visualized within normal branching pattern. No focal filling defect to suggest pulmonary embolism is identified. Mediastinum/Nodes: Thoracic inlet is within normal limits. Esophagus as visualized is unremarkable. No sizable hilar or mediastinal adenopathy is noted. Lungs/Pleura: Lungs are well aerated bilaterally and demonstrate mild bibasilar atelectasis. Minimal emphysematous changes are seen. No focal infiltrate or sizable effusion is seen. No sizable parenchymal nodules are noted. Musculoskeletal: Degenerative changes of the thoracic spine are noted. No acute rib abnormality is noted. Review of the MIP images confirms the above findings. CT ABDOMEN and PELVIS FINDINGS Hepatobiliary: No focal liver abnormality is seen. No gallstones, gallbladder wall thickening, or biliary dilatation.  Pancreas: Unremarkable. No pancreatic ductal dilatation or surrounding inflammatory changes. Spleen: Normal in size without focal abnormality. Adrenals/Urinary Tract:  Adrenal glands are within normal limits. Kidneys demonstrate a normal enhancement pattern on the right. Left kidney demonstrates a large irregular fluid attenuation collection in the upper pole measuring 4.3 x 4.0 cm in greatest AP and transverse dimensions. It extends for approximately 3.6 cm in craniocaudad projection. Delayed images show multiple areas of decreased enhancement consistent with pyelonephritis. A small subcapsular fluid collection is noted in the lower pole medially likely of similar etiology. No obstructive changes are seen. No ureteral stones are noted. The bladder is decompressed. Tiny nonobstructing lower pole stone is noted on the right. This is stable from the most recent exam. Stomach/Bowel: No obstructive changes in the small bowel are noted. Stomach is within normal limits. Colon shows scattered diverticular change without evidence of diverticulitis. No obstructive changes are seen. The appendix is within normal limits. Vascular/Lymphatic: Aortic atherosclerosis. No enlarged abdominal or pelvic lymph nodes. Reproductive: Uterine fibroids are seen. Largest of these measures 3.7 cm. No adnexal mass is noted. Other: No abdominal wall hernia or abnormality. No abdominopelvic ascites. Musculoskeletal: No acute or significant osseous findings. Review of the MIP images confirms the above findings. IMPRESSION: CTA of the chest: No evidence of acute pulmonary emboli. Mild emphysematous changes without acute infiltrate. CT of the abdomen and pelvis: In the upper pole of the left kidney, there is an irregular fluid collection identified consistent with focal renal abscess. This is similar to that seen on prior exam but better delineated due to the contrast enhancement. Additionally a small subcapsular fluid collection is noted inferiorly as well as multiple areas of decreased enhancement on delayed images. These changes are consistent with pyelonephritis. Uterine fibroid disease. Diverticulosis  without diverticulitis. Tiny nonobstructing right renal stone stable from the previous day. Critical Value/emergent results were called by telephone at the time of interpretation on 08/11/2021 at 10:33 pm to Dr. Alyson Ingles, Who verbally acknowledged these results. Electronically Signed   By: Inez Catalina M.D.   On: 08/11/2021 22:36   CT Angio Chest Pulmonary Embolism (PE) W or WO Contrast  Result Date: 08/11/2021 CLINICAL DATA:  History of known kidney infection EXAM: CT ANGIOGRAPHY CHEST CT ABDOMEN AND PELVIS WITH CONTRAST TECHNIQUE: Multidetector CT imaging of the chest was performed using the standard protocol during bolus administration of intravenous contrast. Multiplanar CT image reconstructions and MIPs were obtained to evaluate the vascular anatomy. Multidetector CT imaging of the abdomen and pelvis was performed using the standard protocol during bolus administration of intravenous contrast. CONTRAST:  132mL OMNIPAQUE IOHEXOL 350 MG/ML SOLN COMPARISON:  08/10/2021 noncontrast CT of the abdomen FINDINGS: CTA CHEST FINDINGS Cardiovascular: Thoracic aorta and its branches are within normal limits. No aneurysmal dilatation or dissection is noted. Heart is mildly enlarged in size. The pulmonary artery is well visualized within normal branching pattern. No focal filling defect to suggest pulmonary embolism is identified. Mediastinum/Nodes: Thoracic inlet is within normal limits. Esophagus as visualized is unremarkable. No sizable hilar or mediastinal adenopathy is noted. Lungs/Pleura: Lungs are well aerated bilaterally and demonstrate mild bibasilar atelectasis. Minimal emphysematous changes are seen. No focal infiltrate or sizable effusion is seen. No sizable parenchymal nodules are noted. Musculoskeletal: Degenerative changes of the thoracic spine are noted. No acute rib abnormality is noted. Review of the MIP images confirms the above findings. CT ABDOMEN and PELVIS FINDINGS Hepatobiliary: No focal liver  abnormality is seen. No gallstones, gallbladder wall thickening, or biliary dilatation.  Pancreas: Unremarkable. No pancreatic ductal dilatation or surrounding inflammatory changes. Spleen: Normal in size without focal abnormality. Adrenals/Urinary Tract: Adrenal glands are within normal limits. Kidneys demonstrate a normal enhancement pattern on the right. Left kidney demonstrates a large irregular fluid attenuation collection in the upper pole measuring 4.3 x 4.0 cm in greatest AP and transverse dimensions. It extends for approximately 3.6 cm in craniocaudad projection. Delayed images show multiple areas of decreased enhancement consistent with pyelonephritis. A small subcapsular fluid collection is noted in the lower pole medially likely of similar etiology. No obstructive changes are seen. No ureteral stones are noted. The bladder is decompressed. Tiny nonobstructing lower pole stone is noted on the right. This is stable from the most recent exam. Stomach/Bowel: No obstructive changes in the small bowel are noted. Stomach is within normal limits. Colon shows scattered diverticular change without evidence of diverticulitis. No obstructive changes are seen. The appendix is within normal limits. Vascular/Lymphatic: Aortic atherosclerosis. No enlarged abdominal or pelvic lymph nodes. Reproductive: Uterine fibroids are seen. Largest of these measures 3.7 cm. No adnexal mass is noted. Other: No abdominal wall hernia or abnormality. No abdominopelvic ascites. Musculoskeletal: No acute or significant osseous findings. Review of the MIP images confirms the above findings. IMPRESSION: CTA of the chest: No evidence of acute pulmonary emboli. Mild emphysematous changes without acute infiltrate. CT of the abdomen and pelvis: In the upper pole of the left kidney, there is an irregular fluid collection identified consistent with focal renal abscess. This is similar to that seen on prior exam but better delineated due to the  contrast enhancement. Additionally a small subcapsular fluid collection is noted inferiorly as well as multiple areas of decreased enhancement on delayed images. These changes are consistent with pyelonephritis. Uterine fibroid disease. Diverticulosis without diverticulitis. Tiny nonobstructing right renal stone stable from the previous day. Critical Value/emergent results were called by telephone at the time of interpretation on 08/11/2021 at 10:33 pm to Dr. Alyson Ingles, Who verbally acknowledged these results. Electronically Signed   By: Inez Catalina M.D.   On: 08/11/2021 22:36   CT IMAGE GUIDED DRAINAGE BY PERCUTANEOUS CATHETER  Result Date: 08/13/2021 INDICATION: 55 year old female with a left renal abscess referred for drainage EXAM: CT GUIDED DRAINAGE OF  ABSCESS MEDICATIONS: The patient is currently admitted to the hospital and receiving intravenous antibiotics. The antibiotics were administered within an appropriate time frame prior to the initiation of the procedure. ANESTHESIA/SEDATION: 2.0 mg IV Versed 100 mcg IV Fentanyl Moderate Sedation Time:  16 minutes The patient was continuously monitored during the procedure by the interventional radiology nurse under my direct supervision. COMPLICATIONS: None TECHNIQUE: Informed written consent was obtained from the patient after a thorough discussion of the procedural risks, benefits and alternatives. All questions were addressed. Maximal Sterile Barrier Technique was utilized including caps, mask, sterile gowns, sterile gloves, sterile drape, hand hygiene and skin antiseptic. A timeout was performed prior to the initiation of the procedure. PROCEDURE: Patient was position prone position on the CT gantry table and a scout CT of the upper abdomen was performed. The left flank was prepped with chlorhexidine in a sterile fashion, and a sterile drape was applied covering the operative field. A sterile gown and sterile gloves were used for the procedure. Local  anesthesia was provided with 1% Lidocaine. Once the patient is prepped and draped in the usual sterile fashion and the skin was anesthetized overlying the left kidney, a Yueh needle was advanced with CT guidance from just adjacent to the left  twelfth rib, directed in a cephalad trajectory, in an effort to avoid the lowest margin of the diaphragm. Given the position of the abscess at the superior pole of the left kidney, this cephalad trajectory was necessary. Once complex fluid was aspirated from the Yueh needle, modified Seldinger technique was used to place a 12 [place] drain into the abscess. Aspiration of purulent material was sent for culture. The drain was attached to bulb suction. Drain was sutured in position. Patient tolerated the procedure well and remained hemodynamically stable throughout. No complications were encountered and no significant blood loss. FINDINGS: Twelve French drain into the superior position abscess within the left renal cortex. IMPRESSION: Status post CT-guided drainage of left renal abscess at the superior pole. Signed, Dulcy Fanny. Dellia Nims, RPVI [facility] Electronically Signed   By: Corrie Mckusick D.O.   On: 08/13/2021 08:28    Labs:  CBC: Recent Labs    08/12/21 0030 08/12/21 0225 08/13/21 0433 08/14/21 0420  WBC 10.9* 10.7* 19.6* 10.3  HGB 12.1 11.3* 11.1* 11.1*  HCT 38.3 35.0* 34.6* 35.5*  PLT 694* 646* 575* 505*    COAGS: No results for input(s): INR, APTT in the last 8760 hours.  BMP: Recent Labs    08/12/21 0030 08/12/21 0225 08/13/21 0433 08/14/21 0420  NA 134* 137 136 136  K 3.8 3.7 4.2 3.5  CL 101 105 102 103  CO2 24 23 25 23   GLUCOSE 100* 102* 117* 105*  BUN 12 11 11 7   CALCIUM 8.7* 8.5* 8.4* 8.3*  CREATININE 0.56 0.47 0.65 0.56  GFRNONAA >60 >60 >60 >60    LIVER FUNCTION TESTS: Recent Labs    06/30/21 1536 07/31/21 1641 08/01/21 0430 08/12/21 0030  BILITOT 0.2 1.0 0.2*  0.4  AST 28 29 24  36  ALT 43* 20 25 85*  ALKPHOS  --  69 62 69  PROT 7.3 7.7 7.2 8.1  ALBUMIN  --  3.5 2.8* 3.3*    Assessment and Plan:  Left renal abscess --drain in place, 100cc last 24 --? discharge tomorrow, will put in orders/instructions.  Continue TID flushes with 5 cc NS. Record output Q shift. Dressing changes QD or PRN if soiled.  Call IR APP or on call IR MD if difficulty flushing or sudden change in drain output.  Repeat imaging/possible drain injection once output < 10 mL/QD (excluding flush material.)  Discharge planning: Please contact IR APP or on call IR MD prior to patient d/c to ensure appropriate follow up plans are in place. Typically patient will follow up with IR clinic 10-14 days post d/c for repeat imaging/possible drain injection. IR scheduler will contact patient with date/time of appointment. Patient will need to flush drain QD with 5 cc NS, record output QD, dressing changes every 2-3 days or earlier if soiled.    Electronically Signed: Pasty Spillers, PA 08/14/2021, 4:35 PM   I spent a total of 35 Minutes at the the patient's bedside AND on the patient's hospital floor or unit, greater than 50% of which was counseling/coordinating care for left renal abscess.

## 2021-08-15 MED ORDER — SODIUM CHLORIDE 0.9 % IJ SOLN: 10.0000 mL | Freq: Three times a day (TID) | INTRAMUSCULAR | 0 refills | Status: DC

## 2021-08-15 MED ORDER — SULFAMETHOXAZOLE-TRIMETHOPRIM 800-160 MG PO TABS: 1.0000 | ORAL_TABLET | Freq: Two times a day (BID) | ORAL | 0 refills | Status: AC

## 2021-08-15 MED ORDER — SULFAMETHOXAZOLE-TRIMETHOPRIM 800-160 MG PO TABS
1.0000 | ORAL_TABLET | Freq: Two times a day (BID) | ORAL | Status: DC
  Administered 2021-08-15: 1 via ORAL
  Filled 2021-08-15: qty 1

## 2021-08-15 NOTE — Discharge Summary (Addendum)
Physician Discharge Summary  Jacqueline Finley LTR:[id number] DOB: [birth year] DOA: 08/11/2021  PCP: Inda Coke, PA  Admit date: 08/11/2021 Discharge date: 08/15/2021  Admitted From: Home Disposition:  Home  Discharge Condition:Stable CODE STATUS:FULL Diet recommendation: Heart Healthy   Brief/Interim Summary:  Patient is a 55 year old female with history of hypertension, infected left ureteral stone was recently admitted here earlier this month and was treated with a stent placement and was discharged with oral antibiotics came back with continuous low-grade fever, left flank pain at home.  CT abdomen/pelvis done as ordered by urology showed left renal abscess and she was sent to the emergency room for admission.  IR consulted and she underwent drainage on 08/12/21.  Urology  were following.  Plan for discharge today to home with oral antibiotics. She we will continue drain at home and follow-up with IR and urology as an outpatient.  Following problems were addressed during her hospitalization:  Sepsis secondary to left renal abscess:H/o infected left ureteral stone,she  was recently admitted here earlier this month and was treated with a stent placement and was discharged with oral antibiotics came back with continuous low-grade fever, left flank pain at home.  CT abdomen/pelvis done as ordered by urology showed left renal abscess and she was sent to the emergency room for admission.   IR consulted and she underwent drainage on 08/12/21.  Urology  were following.     Anaerobic/aerobic cultures showing Klebsiella pneumonia, mostly sensitive.  She will be discharged on 10 days course of Bactrim.  She will follow-up with urology as an outpatient and also IR.  As per urology, she needs to be on suppressive antibiotics for 3 months, urology will order that as an outpatient   Hypertension: Currently blood pressure stable.    Takes hydrochlorothiazide at home.   History of hypothyroidism:  Continue Synthyroid     Discharge Diagnoses:  Principal Problem:   Renal abscess, left Active Problems:   Hypertension   Klebsiella pneumoniae infection    Discharge Instructions  Discharge Instructions     Call MD for:  persistant nausea and vomiting   Complete by: As directed    Call MD for:  redness, tenderness, or signs of infection (pain, swelling, redness, odor or green/yellow discharge around incision site)   Complete by: As directed    Call MD for:  severe uncontrolled pain   Complete by: As directed    Call MD for:  temperature >100.4   Complete by: As directed    Change dressing (specify)   Complete by: As directed    Dressing change: change every 2-3 days, or sooner if needed.   Diet - low sodium heart healthy   Complete by: As directed    Discharge instructions   Complete by: As directed    1) Please take prescribed medications as instructed  2)Follow up with urology in a week . 3)Follow up with your PCP in 1 to 2 weeks  4)keep drainage site clean and dry, changing dressing every 2-3 days or sooner if soiled.  Flush catheter drain 2x a day and record daily output. May take motrin or tylenol as instructed Clinic will call you with your 10-14 day follow up appointment. You may call [phone] with questions or concerns   Increase activity slowly   Complete by: As directed       Allergies as of 08/15/2021       Reactions   Morphine Nausea Only   Scopolamine Other (See Comments)   Accute angle  closure glaucoma        Medication List     TAKE these medications    bimatoprost 0.03 % ophthalmic solution Commonly known as: LATISSE Place 1 application into both eyes See admin instructions. Apply topically along upper eyelid margin at base of eyelashes daily at bedtime   hydrochlorothiazide 25 MG tablet Commonly known as: HYDRODIURIL TAKE 1 TABLET BY MOUTH EVERY DAY IN THE MORNING What changed:  how much to take how to take this when to take  this additional instructions   HYDROcodone-acetaminophen 5-325 MG tablet Commonly known as: NORCO/VICODIN Take 1-2 tablets by mouth every 6 (six) hours as needed for moderate pain.   ondansetron 4 MG tablet Commonly known as: ZOFRAN Take 1 tablet (4 mg total) by mouth every 6 (six) hours as needed for up to 5 days for nausea.   sodium chloride 0.9 % injection Inject 10 mLs into the vein every 8 (eight) hours for 14 days.   sulfamethoxazole-trimethoprim 800-160 MG tablet Commonly known as: BACTRIM DS Take 1 tablet by mouth every 12 (twelve) hours for 10 days.   Synthroid 112 MCG tablet Generic drug: levothyroxine TAKE 1 TABLET BY MOUTH EVERY MORNING ON AN EMPTY STOMACH WITH A GLASS OF WATER AT LEAST 30 TO 60 MINUTES BEFORE BREAKFAST What changed:  how much to take how to take this when to take this additional instructions   venlafaxine XR 150 MG 24 hr capsule Commonly known as: EFFEXOR-XR Take 1 capsule (150 mg total) by mouth 2 (two) times daily. What changed:  how much to take when to take this               Discharge Care Instructions  (From admission, onward)           Start     Ordered   08/14/21 0000  Change dressing (specify)       Comments: Dressing change: change every 2-3 days, or sooner if needed.   08/14/21 1700            Follow-up Information     [facility] Follow up.   Why: The office will call with a f/u appointment for 10-14 days, but if you haven't heard anything by 11/28 please call the office to schedule. Contact information: [address] [place]        Inda Coke, [place]. Schedule an appointment as soon as possible for a visit in 1 week(s).   Specialty: Physician Assistant Contact information: [place] [address] [phone]                Allergies  Allergen Reactions   Morphine Nausea Only   Scopolamine Other (See  Comments)    Accute angle closure glaucoma    Consultations: Urology   Procedures/Studies: CT ABDOMEN PELVIS W WO CONTRAST  Result Date: 08/11/2021 CLINICAL DATA:  History of known kidney infection EXAM: CT ANGIOGRAPHY CHEST CT ABDOMEN AND PELVIS WITH CONTRAST TECHNIQUE: Multidetector CT imaging of the chest was performed using the standard protocol during bolus administration of intravenous contrast. Multiplanar CT image reconstructions and MIPs were obtained to evaluate the vascular anatomy. Multidetector CT imaging of the abdomen and pelvis was performed using the standard protocol during bolus administration of intravenous contrast. CONTRAST:  130mL OMNIPAQUE IOHEXOL 350 MG/ML SOLN COMPARISON:  08/10/2021 noncontrast CT of the abdomen FINDINGS: CTA CHEST FINDINGS Cardiovascular: Thoracic aorta and its branches are within normal limits. No aneurysmal dilatation  or dissection is noted. Heart is mildly enlarged in size. The pulmonary artery is well visualized within normal branching pattern. No focal filling defect to suggest pulmonary embolism is identified. Mediastinum/Nodes: Thoracic inlet is within normal limits. Esophagus as visualized is unremarkable. No sizable hilar or mediastinal adenopathy is noted. Lungs/Pleura: Lungs are well aerated bilaterally and demonstrate mild bibasilar atelectasis. Minimal emphysematous changes are seen. No focal infiltrate or sizable effusion is seen. No sizable parenchymal nodules are noted. Musculoskeletal: Degenerative changes of the thoracic spine are noted. No acute rib abnormality is noted. Review of the MIP images confirms the above findings. CT ABDOMEN and PELVIS FINDINGS Hepatobiliary: No focal liver abnormality is seen. No gallstones, gallbladder wall thickening, or biliary dilatation. Pancreas: Unremarkable. No pancreatic ductal dilatation or surrounding inflammatory changes. Spleen: Normal in size without focal abnormality. Adrenals/Urinary Tract: Adrenal  glands are within normal limits. Kidneys demonstrate a normal enhancement pattern on the right. Left kidney demonstrates a large irregular fluid attenuation collection in the upper pole measuring 4.3 x 4.0 cm in greatest AP and transverse dimensions. It extends for approximately 3.6 cm in craniocaudad projection. Delayed images show multiple areas of decreased enhancement consistent with pyelonephritis. A small subcapsular fluid collection is noted in the lower pole medially likely of similar etiology. No obstructive changes are seen. No ureteral stones are noted. The bladder is decompressed. Tiny nonobstructing lower pole stone is noted on the right. This is stable from the most recent exam. Stomach/Bowel: No obstructive changes in the small bowel are noted. Stomach is within normal limits. Colon shows scattered diverticular change without evidence of diverticulitis. No obstructive changes are seen. The appendix is within normal limits. Vascular/Lymphatic: Aortic atherosclerosis. No enlarged abdominal or pelvic lymph nodes. Reproductive: Uterine fibroids are seen. Largest of these measures 3.7 cm. No adnexal mass is noted. Other: No abdominal wall hernia or abnormality. No abdominopelvic ascites. Musculoskeletal: No acute or significant osseous findings. Review of the MIP images confirms the above findings. IMPRESSION: CTA of the chest: No evidence of acute pulmonary emboli. Mild emphysematous changes without acute infiltrate. CT of the abdomen and pelvis: In the upper pole of the left kidney, there is an irregular fluid collection identified consistent with focal renal abscess. This is similar to that seen on prior exam but better delineated due to the contrast enhancement. Additionally a small subcapsular fluid collection is noted inferiorly as well as multiple areas of decreased enhancement on delayed images. These changes are consistent with pyelonephritis. Uterine fibroid disease. Diverticulosis without  diverticulitis. Tiny nonobstructing right renal stone stable from the previous day. Critical Value/emergent results were called by telephone at the time of interpretation on 08/11/2021 at 10:33 pm to Dr. Alyson Ingles, Who verbally acknowledged these results. Electronically Signed   By: Inez Catalina M.D.   On: 08/11/2021 22:36   CT Angio Chest Pulmonary Embolism (PE) W or WO Contrast  Result Date: 08/11/2021 CLINICAL DATA:  History of known kidney infection EXAM: CT ANGIOGRAPHY CHEST CT ABDOMEN AND PELVIS WITH CONTRAST TECHNIQUE: Multidetector CT imaging of the chest was performed using the standard protocol during bolus administration of intravenous contrast. Multiplanar CT image reconstructions and MIPs were obtained to evaluate the vascular anatomy. Multidetector CT imaging of the abdomen and pelvis was performed using the standard protocol during bolus administration of intravenous contrast. CONTRAST:  174mL OMNIPAQUE IOHEXOL 350 MG/ML SOLN COMPARISON:  08/10/2021 noncontrast CT of the abdomen FINDINGS: CTA CHEST FINDINGS Cardiovascular: Thoracic aorta and its branches are within normal limits. No aneurysmal dilatation or  dissection is noted. Heart is mildly enlarged in size. The pulmonary artery is well visualized within normal branching pattern. No focal filling defect to suggest pulmonary embolism is identified. Mediastinum/Nodes: Thoracic inlet is within normal limits. Esophagus as visualized is unremarkable. No sizable hilar or mediastinal adenopathy is noted. Lungs/Pleura: Lungs are well aerated bilaterally and demonstrate mild bibasilar atelectasis. Minimal emphysematous changes are seen. No focal infiltrate or sizable effusion is seen. No sizable parenchymal nodules are noted. Musculoskeletal: Degenerative changes of the thoracic spine are noted. No acute rib abnormality is noted. Review of the MIP images confirms the above findings. CT ABDOMEN and PELVIS FINDINGS Hepatobiliary: No focal liver  abnormality is seen. No gallstones, gallbladder wall thickening, or biliary dilatation. Pancreas: Unremarkable. No pancreatic ductal dilatation or surrounding inflammatory changes. Spleen: Normal in size without focal abnormality. Adrenals/Urinary Tract: Adrenal glands are within normal limits. Kidneys demonstrate a normal enhancement pattern on the right. Left kidney demonstrates a large irregular fluid attenuation collection in the upper pole measuring 4.3 x 4.0 cm in greatest AP and transverse dimensions. It extends for approximately 3.6 cm in craniocaudad projection. Delayed images show multiple areas of decreased enhancement consistent with pyelonephritis. A small subcapsular fluid collection is noted in the lower pole medially likely of similar etiology. No obstructive changes are seen. No ureteral stones are noted. The bladder is decompressed. Tiny nonobstructing lower pole stone is noted on the right. This is stable from the most recent exam. Stomach/Bowel: No obstructive changes in the small bowel are noted. Stomach is within normal limits. Colon shows scattered diverticular change without evidence of diverticulitis. No obstructive changes are seen. The appendix is within normal limits. Vascular/Lymphatic: Aortic atherosclerosis. No enlarged abdominal or pelvic lymph nodes. Reproductive: Uterine fibroids are seen. Largest of these measures 3.7 cm. No adnexal mass is noted. Other: No abdominal wall hernia or abnormality. No abdominopelvic ascites. Musculoskeletal: No acute or significant osseous findings. Review of the MIP images confirms the above findings. IMPRESSION: CTA of the chest: No evidence of acute pulmonary emboli. Mild emphysematous changes without acute infiltrate. CT of the abdomen and pelvis: In the upper pole of the left kidney, there is an irregular fluid collection identified consistent with focal renal abscess. This is similar to that seen on prior exam but better delineated due to the  contrast enhancement. Additionally a small subcapsular fluid collection is noted inferiorly as well as multiple areas of decreased enhancement on delayed images. These changes are consistent with pyelonephritis. Uterine fibroid disease. Diverticulosis without diverticulitis. Tiny nonobstructing right renal stone stable from the previous day. Critical Value/emergent results were called by telephone at the time of interpretation on 08/11/2021 at 10:33 pm to Dr. Alyson Ingles, Who verbally acknowledged these results. Electronically Signed   By: Inez Catalina M.D.   On: 08/11/2021 22:36   DG C-Arm 1-60 Min-No Report  Result Date: 07/31/2021 Fluoroscopy was utilized by the requesting physician.  No radiographic interpretation.   CT IMAGE GUIDED DRAINAGE BY PERCUTANEOUS CATHETER  Result Date: 08/13/2021 INDICATION: 55 year old female with a left renal abscess referred for drainage EXAM: CT GUIDED DRAINAGE OF  ABSCESS MEDICATIONS: The patient is currently admitted to the hospital and receiving intravenous antibiotics. The antibiotics were administered within an appropriate time frame prior to the initiation of the procedure. ANESTHESIA/SEDATION: 2.0 mg IV Versed 100 mcg IV Fentanyl Moderate Sedation Time:  16 minutes The patient was continuously monitored during the procedure by the interventional radiology nurse under my direct supervision. COMPLICATIONS: None TECHNIQUE: Informed written consent was  obtained from the patient after a thorough discussion of the procedural risks, benefits and alternatives. All questions were addressed. Maximal Sterile Barrier Technique was utilized including caps, mask, sterile gowns, sterile gloves, sterile drape, hand hygiene and skin antiseptic. A timeout was performed prior to the initiation of the procedure. PROCEDURE: Patient was position prone position on the CT gantry table and a scout CT of the upper abdomen was performed. The left flank was prepped with chlorhexidine in a  sterile fashion, and a sterile drape was applied covering the operative field. A sterile gown and sterile gloves were used for the procedure. Local anesthesia was provided with 1% Lidocaine. Once the patient is prepped and draped in the usual sterile fashion and the skin was anesthetized overlying the left kidney, a Yueh needle was advanced with CT guidance from just adjacent to the left twelfth rib, directed in a cephalad trajectory, in an effort to avoid the lowest margin of the diaphragm. Given the position of the abscess at the superior pole of the left kidney, this cephalad trajectory was necessary. Once complex fluid was aspirated from the Yueh needle, modified Seldinger technique was used to place a 12 [place] drain into the abscess. Aspiration of purulent material was sent for culture. The drain was attached to bulb suction. Drain was sutured in position. Patient tolerated the procedure well and remained hemodynamically stable throughout. No complications were encountered and no significant blood loss. FINDINGS: Twelve French drain into the superior position abscess within the left renal cortex. IMPRESSION: Status post CT-guided drainage of left renal abscess at the superior pole. Signed, Dulcy Fanny. Dellia Nims, RPVI [facility] Electronically Signed   By: Corrie Mckusick D.O.   On: 08/13/2021 08:28      Subjective: Patient seen and examined at the bedside this morning.  Hemodynamically stable for discharge  Discharge Exam: Vitals:   08/14/21 2131 08/15/21 0531  BP: 127/76 (!) 147/86  Pulse: 93 71  Resp: 18 18  Temp: 98.7 F (37.1 C) 98.4 F (36.9 C)  SpO2: 94% 93%   Vitals:   08/14/21 0521 08/14/21 1517 08/14/21 2131 08/15/21 0531  BP: 121/71 132/77 127/76 (!) 147/86  Pulse: 73 90 93 71  Resp: 18 18 18 18   Temp: 97.8 F (36.6 C) 98 F (36.7 C) 98.7 F (37.1 C) 98.4 F (36.9 C)  TempSrc: Oral Oral Oral Oral  SpO2: 97% 98% 94%  93%  Weight:      Height:        General: Pt is alert, awake, not in acute distress Cardiovascular: RRR, S1/S2 +, no rubs, no gallops Respiratory: CTA bilaterally, no wheezing, no rhonchi Abdominal: Soft, NT, ND, bowel sounds +, left flank pigtail drain Extremities: no edema, no cyanosis    The results of significant diagnostics from this hospitalization (including imaging, microbiology, ancillary and laboratory) are listed below for reference.     Microbiology: Recent Results (from the past 240 hour(s))  Resp Panel by RT-PCR (Flu A&B, Covid) Nasopharyngeal Swab     Status: None   Collection Time: 08/12/21 12:00 AM   Specimen: Nasopharyngeal Swab; Nasopharyngeal(NP) swabs in vial transport medium  Result Value Ref Range Status   SARS Coronavirus 2 by RT PCR NEGATIVE NEGATIVE Final    Comment: (NOTE) SARS-CoV-2 target nucleic acids are NOT DETECTED.  The SARS-CoV-2 RNA is generally detectable in upper respiratory specimens during the acute phase of infection. The lowest concentration of SARS-CoV-2 viral copies this assay can detect is  138 copies/mL. A negative result does not preclude SARS-Cov-2 infection and should not be used as the sole basis for treatment or other patient management decisions. A negative result may occur with  improper specimen collection/handling, submission of specimen other than nasopharyngeal swab, presence of viral mutation(s) within the areas targeted by this assay, and inadequate number of viral copies(<138 copies/mL). A negative result must be combined with clinical observations, patient history, and epidemiological information. The expected result is Negative.  Fact Sheet for Patients:  EntrepreneurPulse.com.au  Fact Sheet for Healthcare Providers:  IncredibleEmployment.be  This test is no t yet approved or cleared by the [place] FDA and  has been authorized for detection and/or diagnosis of SARS-CoV-2  by FDA under an Emergency Use Authorization (EUA). This EUA will remain  in effect (meaning this test can be used) for the duration of the COVID-19 declaration under Section 564(b)(1) of the Act, 21 U.S.C.section 360bbb-3(b)(1), unless the authorization is terminated  or revoked sooner.       Influenza A by PCR NEGATIVE NEGATIVE Final   Influenza B by PCR NEGATIVE NEGATIVE Final    Comment: (NOTE) The Xpert Xpress SARS-CoV-2/FLU/RSV plus assay is intended as an aid in the diagnosis of influenza from Nasopharyngeal swab specimens and should not be used as a sole basis for treatment. Nasal washings and aspirates are unacceptable for Xpert Xpress SARS-CoV-2/FLU/RSV testing.  Fact Sheet for Patients: EntrepreneurPulse.com.au  Fact Sheet for Healthcare Providers: IncredibleEmployment.be  This test is not yet approved or cleared by the [place] FDA and has been authorized for detection and/or diagnosis of SARS-CoV-2 by FDA under an Emergency Use Authorization (EUA). This EUA will remain in effect (meaning this test can be used) for the duration of the COVID-19 declaration under Section 564(b)(1) of the Act, 21 U.S.C. section 360bbb-3(b)(1), unless the authorization is terminated or revoked.  Performed at [facility], [place] [address] [place], [place] [address]   Blood culture (routine x 2)     Status: None (Preliminary result)   Collection Time: 08/12/21 12:30 AM   Specimen: BLOOD  Result Value Ref Range Status   Specimen Description   Final    BLOOD BLOOD RIGHT FOREARM Performed at [place] [address] [place], [place] [address]    Special Requests   Final    BOTTLES DRAWN AEROBIC AND ANAEROBIC Blood Culture adequate volume Performed at [place] [address], [place], [place] [address]    Culture   Final    NO GROWTH 3 DAYS Performed at [facility],  [place] [address], [place], [place] [address]    Report Status PENDING  Incomplete  Urine Culture     Status: None   Collection Time: 08/12/21 12:30 AM   Specimen: Urine, Clean Catch  Result Value Ref Range Status   Specimen Description   Final    URINE, CLEAN CATCH Performed at [facility], [place] [address], [place], [place] [address]    Special Requests   Final    NONE Performed at [facility], [place] [address] [place], [place] [address]    Culture   Final    NO GROWTH Performed at [facility], [place] [address], [place],  [address]    Report Status 08/13/2021 FINAL  Final  Blood culture (routine x 2)     Status: None (Preliminary result)   Collection Time: 08/12/21 12:33 AM   Specimen: BLOOD  Result  Value Ref Range Status   Specimen Description   Final    BLOOD LEFT ANTECUBITAL Performed at [place] [address] [place], [place] [address]    Special Requests   Final    BOTTLES DRAWN AEROBIC AND ANAEROBIC Blood Culture results may not be optimal due to an inadequate volume of blood received in culture bottles Performed at [place] [address], [place], [place] [address]    Culture   Final    NO GROWTH 3 DAYS Performed at [facility], [place] [address], [place], [place] [address]    Report Status PENDING  Incomplete  Aerobic/Anaerobic Culture w Gram Stain (surgical/deep wound)     Status: None (Preliminary result)   Collection Time: 08/12/21  4:05 AM   Specimen: Abscess  Result Value Ref Range Status   Specimen Description   Final    ABSCESS LEFT KIDNEY Performed at [place] [address], [place], [place] [address]    Special Requests   Final    NONE Performed at [facility], [place] [address] [place], [place] [address]    Gram Stain   Final    NO SQUAMOUS EPITHELIAL CELLS SEEN FEW WBC SEEN NO ORGANISMS SEEN Performed at  [facility], [place] [address], [place],  [address]    Culture   Final    FEW KLEBSIELLA PNEUMONIAE NO ANAEROBES ISOLATED; CULTURE IN PROGRESS FOR 5 DAYS    Report Status PENDING  Incomplete   Organism ID, Bacteria KLEBSIELLA PNEUMONIAE  Final      Susceptibility   Klebsiella pneumoniae - MIC*    AMPICILLIN >=32 RESISTANT Resistant     CEFAZOLIN <=4 SENSITIVE Sensitive     CEFEPIME <=0.12 SENSITIVE Sensitive     CEFTAZIDIME <=1 SENSITIVE Sensitive     CEFTRIAXONE <=0.25 SENSITIVE Sensitive     CIPROFLOXACIN <=0.25 SENSITIVE Sensitive     GENTAMICIN <=1 SENSITIVE Sensitive     IMIPENEM <=0.25 SENSITIVE Sensitive     TRIMETH/SULFA <=20 SENSITIVE Sensitive     AMPICILLIN/SULBACTAM 4 SENSITIVE Sensitive     PIP/TAZO <=4 SENSITIVE Sensitive     * FEW KLEBSIELLA PNEUMONIAE     Labs: BNP (last 3 results) No results for input(s): BNP in the last 8760 hours. Basic Metabolic Panel: Recent Labs  Lab 08/12/21 0030 08/12/21 0225 08/13/21 0433 08/14/21 0420  NA 134* 137 136 136  K 3.8 3.7 4.2 3.5  CL 101 105 102 103  CO2 24 23 25 23   GLUCOSE 100* 102* 117* 105*  BUN 12 11 11 7   CREATININE 0.56 0.47 0.65 0.56  CALCIUM 8.7* 8.5* 8.4* 8.3*   Liver Function Tests: Recent Labs  Lab 08/12/21 0030  AST 36  ALT 85*  ALKPHOS 69  BILITOT 0.4  PROT 8.1  ALBUMIN 3.3*   No results for input(s): LIPASE, AMYLASE in the last 168 hours. No results for input(s): AMMONIA in the last 168 hours. CBC: Recent Labs  Lab 08/12/21 0030 08/12/21 0225 08/13/21 0433 08/14/21 0420  WBC 10.9* 10.7* 19.6* 10.3  NEUTROABS 7.8*  --  18.0* 8.2*  HGB 12.1 11.3* 11.1* 11.1*  HCT 38.3 35.0* 34.6* 35.5*  MCV 85.9 84.5 85.6 86.6  PLT 694* 646* 575* 505*   Cardiac Enzymes: No results for input(s): CKTOTAL, CKMB, CKMBINDEX, TROPONINI in the last 168 hours. BNP: Invalid input(s): POCBNP CBG: No results for input(s): GLUCAP in the last 168 hours. D-Dimer No results for  input(s): DDIMER  in the last 72 hours. Hgb A1c No results for input(s): HGBA1C in the last 72 hours. Lipid Profile No results for input(s): CHOL, HDL, LDLCALC, TRIG, CHOLHDL, LDLDIRECT in the last 72 hours. Thyroid function studies No results for input(s): TSH, T4TOTAL, T3FREE, THYROIDAB in the last 72 hours.  Invalid input(s): FREET3 Anemia work up No results for input(s): VITAMINB12, FOLATE, FERRITIN, TIBC, IRON, RETICCTPCT in the last 72 hours. Urinalysis    Component Value Date/Time   COLORURINE STRAW (A) 08/12/2021 0030   APPEARANCEUR CLEAR 08/12/2021 0030   LABSPEC 1.031 (H) 08/12/2021 0030   PHURINE 6.0 08/12/2021 0030   GLUCOSEU NEGATIVE 08/12/2021 0030   HGBUR MODERATE (A) 08/12/2021 0030   BILIRUBINUR NEGATIVE 08/12/2021 0030   KETONESUR NEGATIVE 08/12/2021 0030   PROTEINUR NEGATIVE 08/12/2021 0030   NITRITE NEGATIVE 08/12/2021 0030   LEUKOCYTESUR NEGATIVE 08/12/2021 0030   Sepsis Labs Invalid input(s): PROCALCITONIN,  WBC,  LACTICIDVEN Microbiology Recent Results (from the past 240 hour(s))  Resp Panel by RT-PCR (Flu A&B, Covid) Nasopharyngeal Swab     Status: None   Collection Time: 08/12/21 12:00 AM   Specimen: Nasopharyngeal Swab; Nasopharyngeal(NP) swabs in vial transport medium  Result Value Ref Range Status   SARS Coronavirus 2 by RT PCR NEGATIVE NEGATIVE Final    Comment: (NOTE) SARS-CoV-2 target nucleic acids are NOT DETECTED.  The SARS-CoV-2 RNA is generally detectable in upper respiratory specimens during the acute phase of infection. The lowest concentration of SARS-CoV-2 viral copies this assay can detect is 138 copies/mL. A negative result does not preclude SARS-Cov-2 infection and should not be used as the sole basis for treatment or other patient management decisions. A negative result may occur with  improper specimen collection/handling, submission of specimen other than nasopharyngeal swab, presence of viral mutation(s) within the areas targeted by this  assay, and inadequate number of viral copies(<138 copies/mL). A negative result must be combined with clinical observations, patient history, and epidemiological information. The expected result is Negative.  Fact Sheet for Patients:  EntrepreneurPulse.com.au  Fact Sheet for Healthcare Providers:  IncredibleEmployment.be  This test is no t yet approved or cleared by the [place] FDA and  has been authorized for detection and/or diagnosis of SARS-CoV-2 by FDA under an Emergency Use Authorization (EUA). This EUA will remain  in effect (meaning this test can be used) for the duration of the COVID-19 declaration under Section 564(b)(1) of the Act, 21 U.S.C.section 360bbb-3(b)(1), unless the authorization is terminated  or revoked sooner.       Influenza A by PCR NEGATIVE NEGATIVE Final   Influenza B by PCR NEGATIVE NEGATIVE Final    Comment: (NOTE) The Xpert Xpress SARS-CoV-2/FLU/RSV plus assay is intended as an aid in the diagnosis of influenza from Nasopharyngeal swab specimens and should not be used as a sole basis for treatment. Nasal washings and aspirates are unacceptable for Xpert Xpress SARS-CoV-2/FLU/RSV testing.  Fact Sheet for Patients: EntrepreneurPulse.com.au  Fact Sheet for Healthcare Providers: IncredibleEmployment.be  This test is not yet approved or cleared by the [place] FDA and has been authorized for detection and/or diagnosis of SARS-CoV-2 by FDA under an Emergency Use Authorization (EUA). This EUA will remain in effect (meaning this test can be used) for the duration of the COVID-19 declaration under Section 564(b)(1) of the Act, 21 U.S.C. section 360bbb-3(b)(1), unless the authorization is terminated or revoked.  Performed at [facility], [place] [address], [place], [place] [address]   Blood culture (routine x  2)     Status: None (Preliminary result)    Collection Time: 08/12/21 12:30 AM   Specimen: BLOOD  Result Value Ref Range Status   Specimen Description   Final    BLOOD BLOOD RIGHT FOREARM Performed at [facility] [address], [place], [place] [address]    Special Requests   Final    BOTTLES DRAWN AEROBIC AND ANAEROBIC Blood Culture adequate volume Performed at [facility] [address] [place], [place] [address]    Culture   Final    NO GROWTH 3 DAYS Performed at [facility], [place] [address], [place], [place] [address]    Report Status PENDING  Incomplete  Urine Culture     Status: None   Collection Time: 08/12/21 12:30 AM   Specimen: Urine, Clean Catch  Result Value Ref Range Status   Specimen Description   Final    URINE, CLEAN CATCH Performed at [facility], [place] [address], [place], [place] [address]    Special Requests   Final    NONE Performed at [facility], [place] [address] [place], [place] [address]    Culture   Final    NO GROWTH Performed at [facility], [place] [address], [place], [place] [address]    Report Status 08/13/2021 FINAL  Final  Blood culture (routine x 2)     Status: None (Preliminary result)   Collection Time: 08/12/21 12:33 AM   Specimen: BLOOD  Result Value Ref Range Status   Specimen Description   Final    BLOOD LEFT ANTECUBITAL Performed at [facility] [address] [place], [place] [address]    Special Requests   Final    BOTTLES DRAWN AEROBIC AND ANAEROBIC Blood Culture results may not be optimal due to an inadequate volume of blood received in culture bottles Performed at [facility] [address], [place], [place] [address]    Culture   Final    NO GROWTH 3 DAYS Performed at [facility], [place] [address] [place], [place] [address]    Report Status PENDING  Incomplete  Aerobic/Anaerobic Culture w Gram Stain (surgical/deep wound)      Status: None (Preliminary result)   Collection Time: 08/12/21  4:05 AM   Specimen: Abscess  Result Value Ref Range Status   Specimen Description   Final    ABSCESS LEFT KIDNEY Performed at [facility] [address], [place], [place] [address]    Special Requests   Final    NONE Performed at [facility], [place] [address], [place], [place] [address]    Gram Stain   Final    NO SQUAMOUS EPITHELIAL CELLS SEEN FEW WBC SEEN NO ORGANISMS SEEN Performed at [facility], [place] [address] [place], [place] [address]    Culture   Final    FEW KLEBSIELLA PNEUMONIAE NO ANAEROBES ISOLATED; CULTURE IN PROGRESS FOR 5 DAYS    Report Status PENDING  Incomplete   Organism ID, Bacteria KLEBSIELLA PNEUMONIAE  Final      Susceptibility   Klebsiella pneumoniae - MIC*    AMPICILLIN >=32 RESISTANT Resistant     CEFAZOLIN <=4 SENSITIVE Sensitive     CEFEPIME <=0.12 SENSITIVE Sensitive     CEFTAZIDIME <=1 SENSITIVE Sensitive     CEFTRIAXONE <=0.25 SENSITIVE Sensitive     CIPROFLOXACIN <=0.25 SENSITIVE Sensitive     GENTAMICIN <=1 SENSITIVE  Sensitive     IMIPENEM <=0.25 SENSITIVE Sensitive     TRIMETH/SULFA <=20 SENSITIVE Sensitive     AMPICILLIN/SULBACTAM 4 SENSITIVE Sensitive     PIP/TAZO <=4 SENSITIVE Sensitive     * FEW KLEBSIELLA PNEUMONIAE    Please note: You were cared for by a hospitalist during your hospital stay. Once you are discharged, your primary care physician will handle any further medical issues. Please note that NO REFILLS for any discharge medications will be authorized once you are discharged, as it is imperative that you return to your primary care physician (or establish a relationship with a primary care physician if you do not have one) for your post hospital discharge needs so that they can reassess your need for medications and monitor your lab values.    Time coordinating discharge: 40 minutes  SIGNED:   Shelly Coss,  MD  [facility] 08/15/2021, 11:49 AM Pager [phone]  If 7PM-7AM, please contact night-coverage [email or web address] Password [email or web address]

## 2021-08-15 NOTE — Progress Notes (Signed)
Discharge instructions and packet provided to pt. All questions answered. Pt verbalized understanding. Pt called her ride.

## 2021-08-16 ENCOUNTER — Other Ambulatory Visit: Payer: Self-pay | Admitting: Urology

## 2021-08-16 ENCOUNTER — Other Ambulatory Visit (HOSPITAL_COMMUNITY): Payer: Self-pay

## 2021-08-16 ENCOUNTER — Telehealth: Payer: Self-pay

## 2021-08-16 ENCOUNTER — Other Ambulatory Visit: Payer: Self-pay | Admitting: Physician Assistant

## 2021-08-16 DIAGNOSIS — N151 Renal and perinephric abscess: Secondary | ICD-10-CM

## 2021-08-16 MED ORDER — NORMAL SALINE FLUSH 0.9 % IV SOLN
5.0000 mL | Freq: Two times a day (BID) | INTRAVENOUS | 0 refills | Status: DC
Start: 2021-08-16 — End: 2021-08-16

## 2021-08-16 MED ORDER — NORMAL SALINE FLUSH 0.9 % IV SOLN
5.0000 mL | Freq: Two times a day (BID) | INTRAVENOUS | 0 refills | Status: AC
  Filled 2021-08-16: qty 280, 28d supply, fill #0

## 2021-08-16 MED ORDER — NORMAL SALINE FLUSH 0.9 % IV SOLN
5.0000 mL | Freq: Two times a day (BID) | INTRAVENOUS | 0 refills | Status: DC
Start: 2021-08-16 — End: 2021-08-16
  Filled 2021-08-16: qty 280, 14d supply, fill #0

## 2021-08-16 MED ORDER — NORMAL SALINE FLUSH 0.9 % IV SOLN
INTRAVENOUS | 3 refills | Status: DC
  Filled 2021-08-16: qty 300, 30d supply, fill #0

## 2021-08-16 NOTE — Progress Notes (Signed)
Pt called and stated that her saline flush rx was not available at [facility]. Pt was advised that rx would be sent to [facility] pharmacy instead.  Saline flush rx re-sent to [facility] electronically.    Narda Rutherford, AGNP-BC 08/16/2021, 10:28 AM

## 2021-08-16 NOTE — Telephone Encounter (Signed)
Transition Care Management Follow-up Telephone Call Date of discharge and from where: [facility] 08/15/21 How have you been since you were released from the hospital? Alright tube is uncomfortable Any questions or concerns? No  Items Reviewed: Did the pt receive and understand the discharge instructions provided? Yes  Medications obtained and verified? Yes  Other? No  Any new allergies since your discharge? No  Dietary orders reviewed? No Do you have support at home? Yes   Home Care and Equipment/Supplies: Were home health services ordered? not applicable If so, what is the name of the agency?   Has the agency set up a time to come to the patient's home? not applicable Were any new equipment or medical supplies ordered?  No What is the name of the medical supply agency?  Were you able to get the supplies/equipment? not applicable Do you have any questions related to the use of the equipment or supplies? No  Functional Questionnaire: (I = Independent and D = Dependent) ADLs: I  Bathing/Dressing- I  Meal Prep- I  Eating- I  Maintaining continence- I  Transferring/Ambulation- I  Managing Meds- I  Follow up appointments reviewed:  PCP Hospital f/u appt confirmed? No   Specialist Hospital f/u appt confirmed? Yes  Scheduled to see Urology on 12/7, 08/31/2021 Are transportation arrangements needed? No  If their condition worsens, is the pt aware to call PCP or go to the Emergency Dept.? Yes Was the patient provided with contact information for the PCP's office or ED? Yes Was to pt encouraged to call back with questions or concerns? Yes

## 2021-08-17 LAB — AEROBIC/ANAEROBIC CULTURE W GRAM STAIN (SURGICAL/DEEP WOUND): Gram Stain: NONE SEEN

## 2021-08-17 LAB — CULTURE, BLOOD (ROUTINE X 2)
Culture: NO GROWTH
Culture: NO GROWTH
Special Requests: ADEQUATE

## 2021-08-31 ENCOUNTER — Encounter: Payer: Self-pay | Admitting: *Deleted

## 2021-08-31 ENCOUNTER — Ambulatory Visit
Admission: RE | Admit: 2021-08-31 | Discharge: 2021-08-31 | Disposition: A | Discharge status: Home / Self Care | Payer: 59 | Source: Ambulatory Visit | Attending: Urology | Admitting: Urology

## 2021-08-31 ENCOUNTER — Other Ambulatory Visit: Payer: Self-pay | Admitting: Urology

## 2021-08-31 ENCOUNTER — Ambulatory Visit
Admission: RE | Admit: 2021-08-31 | Discharge: 2021-08-31 | Disposition: A | Discharge status: Home / Self Care | Payer: 59 | Source: Ambulatory Visit | Attending: Radiology | Admitting: Radiology

## 2021-08-31 DIAGNOSIS — N151 Renal and perinephric abscess: Secondary | ICD-10-CM

## 2021-08-31 HISTORY — PX: IR RADIOLOGIST EVAL & MGMT: IMG5224

## 2021-08-31 MED ORDER — IOPAMIDOL (ISOVUE-300) INJECTION 61%
100.0000 mL | Freq: Once | INTRAVENOUS | Status: AC | PRN
  Administered 2021-08-31: 100 mL via INTRAVENOUS

## 2021-08-31 NOTE — Progress Notes (Signed)
Referring Physician(s): Dr Thera Flake  Chief Complaint: The patient is seen in follow up today s/p 11/19/ procedure:  Twelve French drain into the superior position abscess within the left renal cortex.  History of present illness:  Left renal abscess post renal stone Drain placed in IR 08/12/21  Denies N/V denies pain Denies fever/chills Urinating well Flushes 2 x/day Op is what goes in Still on Cipro daily  Was seen in Urology office yesterday They are waiting for CT result Scheduled today for CT and evaluation of drain/abscess  Past Medical History:  Diagnosis Date   Acute angle-closure glaucoma 2015   from scopalamine patch   Depression    Hypothyroidism    PONV (postoperative nausea and vomiting)     Past Surgical History:  Procedure Laterality Date   [place], URETEROSCOPY AND STENT PLACEMENT Left 07/31/2021   Procedure: CYSTOSCOPY WITH RETROGRADE PYELOGRAM, URETEROSCOPY AND STENT PLACEMENT;  Surgeon: [name], MD;  Location: [facility];  Service: Urology;  Laterality: Left;   INCISION AND DRAINAGE     due to C-section   ORIF FINGER FRACTURE Right    4th digit   PARATHYROIDECTOMY      Allergies: Morphine and Scopolamine  Medications: Prior to Admission medications   Medication Sig Start Date End Date Taking? Authorizing Provider  bimatoprost (LATISSE) 0.03 % ophthalmic solution Place 1 application into both eyes See admin instructions. Apply topically along upper eyelid margin at base of eyelashes daily at bedtime 06/09/21   [provider]  hydrochlorothiazide (HYDRODIURIL) 25 MG tablet TAKE 1 TABLET BY MOUTH EVERY DAY IN THE MORNING Patient taking differently: Take 25 mg by mouth every morning. 06/30/21   Inda Coke, PA  HYDROcodone-acetaminophen (NORCO/VICODIN) 5-325 MG tablet Take 1-2 tablets by mouth every 6 (six) hours as needed for moderate pain. 08/14/21   Boisseau, Angie Fava, PA  Sodium  Chloride Flush (NORMAL SALINE FLUSH) 0.9 % SOLN Instill 71ml's into the drain every day 08/16/21   Candiss Norse A, PA-C  SYNTHROID 112 MCG tablet TAKE 1 TABLET BY MOUTH EVERY MORNING ON AN EMPTY STOMACH WITH A GLASS OF WATER AT LEAST 30 TO [place] BREAKFAST Patient taking differently: Take 112 mcg by mouth daily before breakfast. 06/30/21   Inda Coke, PA  venlafaxine XR (EFFEXOR-XR) 150 MG 24 hr capsule Take 1 capsule (150 mg total) by mouth 2 (two) times daily. Patient taking differently: Take 300 mg by mouth every morning. 06/30/21 09/28/21  Inda Coke, PA     Family History  Problem Relation Age of Onset   Uterine cancer Mother 2   Heart attack Father    Hypertension Father    Prostate cancer Father    Colon cancer Neg Hx    Breast cancer Neg Hx     Social History   Socioeconomic History   Marital status: Divorced    Spouse name: Not on file   Number of children: Not on file   Years of education: Not on file   Highest education level: Not on file  Occupational History   Occupation: CLINICAL DIETITIAN    Employer: [employer]  Tobacco Use   Smoking status: Never   Smokeless tobacco: Never  Vaping Use   Vaping Use: Never used  Substance and Sexual Activity   Alcohol use: Yes    Comment: occ   Drug use: No   Sexual activity: Yes    Birth control/protection: None  Other  Topics Concern   Not on file  Social History Narrative   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  Physical Exam Skin:    General: Skin is warm.     Comments: Site is clean and dry NT no sign of infection  CT read as abscess resolved per Dr Morrie Sheldon removed and  dressing placed      Imaging: No results found.  Labs:  CBC: Recent Labs    08/12/21 0030 08/12/21 0225 08/13/21 0433 08/14/21 0420  WBC 10.9* 10.7* 19.6* 10.3  HGB 12.1 11.3* 11.1* 11.1*  HCT 38.3 35.0* 34.6* 35.5*  PLT 694* 646* 575* 505*    COAGS: No results for input(s): INR, APTT in the last 8760 hours.  BMP: Recent Labs    08/12/21 0030 08/12/21 0225 08/13/21 0433 08/14/21 0420  NA 134* 137 136 136  K 3.8 3.7 4.2 3.5  CL 101 105 102 103  CO2 24 23 25 23   GLUCOSE 100* 102* 117* 105*  BUN 12 11 11 7   CALCIUM 8.7* 8.5* 8.4* 8.3*  CREATININE 0.56 0.47 0.65 0.56  GFRNONAA >60 >60 >60 >60    LIVER FUNCTION TESTS: Recent Labs    06/30/21 1536 07/31/21 1641 08/01/21 0430 08/12/21 0030  BILITOT 0.2 1.0 0.2* 0.4  AST 28 29 24  36  ALT 43* 20 25 85*  ALKPHOS  --  69 62 69  PROT 7.3 7.7 7.2 8.1  ALBUMIN  --  3.5 2.8* 3.3*    Assessment:  Left renal abscess Drain placed in IR 08/12/21 CT today showing resolution of abscess Drain removed per Dr Kathlene Cote Pt to follow with Dr Jeffie Pollock office Still on course of antibiotics   Signed: Lavonia Drafts, PA-C 08/31/2021, 1:54 PM   Please refer to Dr. Kathlene Cote attestation of this note for management and plan.

## 2021-09-08 NOTE — Progress Notes (Incomplete)
Jacqueline Finley is a 55 y.o. female here for a follow up of Renal Abscess.  SCRIBE STATEMENT  History of Present Illness:   No chief complaint on file.   HPI  Renal Abscess  On 08/11/21, Jacqueline Finley presented to the ED with c/o left flank pain and a low grade fever that had been onset for two weeks. Due to ongoing sx, a CT abdomen/pelvis was completed and showed a left renal abscess which led Jacqueline Finley to be admitted that same day. After consulting IR and urology, pt underwent drainage on 08/12/21. She was then discharged ***   Prior to recent admission on 07/31/21, pt was admitted 07/31/2021 with a diagnosis of Sepsis due to Klebsiella pneumoniae with no resultant organ failure and a left distal ureteral stone. That same day she underwent a cystoscopy with retrograde pyelogram, ureteroscopy, and stent placement. She was found to have passed the stone and have chronic follicular cystitis, but a stent was placed because of ureteral edema and further purulent urine. Jacqueline Finley was initially febrile with a mild AKI, leukocytosis and tachycardia consistent with early sepsis.  Pt was subsequently noted to be hyponatremic and hypokalemic but this was resolved after the hospitalists were consulted. At the time of discharge, sx of sepsis were resolved and she was prescribed cefdinir 300 mg twice daily. In addition she was informed she will need to have suppressive antibiotic for 3 months after completing therapeutic course.   Past Medical History:  Diagnosis Date   Acute angle-closure glaucoma 2015   from scopalamine patch   Depression    Hypothyroidism    PONV (postoperative nausea and vomiting)      Social History   Tobacco Use   Smoking status: Never   Smokeless tobacco: Never  Vaping Use   Vaping Use: Never used  Substance Use Topics   Alcohol use: Yes    Comment: occ   Drug use: No    Past Surgical History:  Procedure Laterality Date   CESAREAN SECTION     CYSTOSCOPY WITH RETROGRADE  PYELOGRAM, URETEROSCOPY AND STENT PLACEMENT Left 07/31/2021   Procedure: CYSTOSCOPY WITH RETROGRADE PYELOGRAM, URETEROSCOPY AND STENT PLACEMENT;  Surgeon: [name], MD;  Location: [facility];  Service: Urology;  Laterality: Left;   INCISION AND DRAINAGE     due to C-section   IR RADIOLOGIST EVAL & MGMT  08/31/2021   ORIF FINGER FRACTURE Right    4th digit   PARATHYROIDECTOMY      Family History  Problem Relation Age of Onset   Uterine cancer Mother 93   Heart attack Father    Hypertension Father    Prostate cancer Father    Colon cancer Neg Hx    Breast cancer Neg Hx     Allergies  Allergen Reactions   Morphine Nausea Only   Scopolamine Other (See Comments)    Accute angle closure glaucoma    Current Medications:   Current Outpatient Medications:    bimatoprost (LATISSE) 0.03 % ophthalmic solution, Place 1 application into both eyes See admin instructions. Apply topically along upper eyelid margin at base of eyelashes daily at bedtime, Disp: , Rfl:    hydrochlorothiazide (HYDRODIURIL) 25 MG tablet, TAKE 1 TABLET BY MOUTH EVERY DAY IN THE MORNING (Patient taking differently: Take 25 mg by mouth every morning.), Disp: 90 tablet, Rfl: 3   HYDROcodone-acetaminophen (NORCO/VICODIN) 5-325 MG tablet, Take 1-2 tablets by mouth every 6 (six) hours as needed for moderate pain., Disp: 30 tablet, Rfl: 0   Sodium Chloride  Flush (NORMAL SALINE FLUSH) 0.9 % SOLN, Instill 63ml's into the drain every day, Disp: 300 mL, Rfl: 3   SYNTHROID 112 MCG tablet, TAKE 1 TABLET BY MOUTH EVERY MORNING ON AN EMPTY STOMACH WITH A GLASS OF WATER AT LEAST 30 TO 60 MINUTES BEFORE BREAKFAST (Patient taking differently: Take 112 mcg by mouth daily before breakfast.), Disp: 90 tablet, Rfl: 3   venlafaxine XR (EFFEXOR-XR) 150 MG 24 hr capsule, Take 1 capsule (150 mg total) by mouth 2 (two) times daily. (Patient taking differently: Take 300 mg by mouth every morning.), Disp: 180 capsule, Rfl: 3   Review of Systems:    ROS Negative unless otherwise specified per HPI. Vitals:   There were no vitals filed for this visit.   There is no height or weight on file to calculate BMI.  Physical Exam:   Physical Exam  Assessment and Plan:        I,Havlyn C Ratchford,acting as a scribe for Inda Coke, PA.,have documented all relevant documentation on the behalf of Inda Coke, PA,as directed by  Inda Coke, PA while in the presence of Inda Coke, [place].  ***  Inda Coke, PA-C

## 2021-09-11 ENCOUNTER — Inpatient Hospital Stay: Payer: 59 | Admitting: Physician Assistant

## 2021-09-11 ENCOUNTER — Other Ambulatory Visit: Payer: Self-pay

## 2021-09-28 NOTE — Progress Notes (Addendum)
Jacqueline Finley is a 56 y.o. female here for a hospital follow up of renal abscess.   History of Present Illness:   Chief Complaint  Patient presents with   Hospitalization Follow-up    Pt was admitted to the hospital on 08/11/2021 for Left Ureteral stone and Sepsis due to pneumonia.    Hypertension    Pt said she needs to change blood pressure medication due to kidney issues.    HPI  Renal Abscess Jacqueline Finley initially visited Dr. Jeffie Pollock, Urology, on 07/29/21 with c/o left flank pain that had been persistent and becoming increasingly worse. After ordering a CT scan it was found that she had a 57mm left distal ureteral stone. Due to nature of sx, left flank pain and fever of 102, and findings, pt was dx with sepsis due to klebsiella pneumoniae with no resultant organ failure and a left distal ureteral stone. On 07/31/21, Jacqueline Finley underwent a cystoscopy with retrograde pyelogram, ureteroscopy, and stent placement.   According to Dr. Jeffie Pollock, she was found to have passed the stone which may have actually been inspissated pus, but a stent was placed because of ureteral edema and further purulent urine. Pt was also found to have chronic follicular cystitis. Initially she was febrile with a mild AKI, leukocytosis, and tachycardia consistent with early sepsis as well as noted to be hyponatremic and hypokalemic, but these have resolved.  Following the procedure, she was given cefdinir 300 mg BID due to klebsiella being found in her culture and informed that she will need suppressive antibiotics for 3 months after completing therapeutic course.   Although Jacqueline Finley was compliant with her medication, she returned to the ED on 08/11/21 with c/o continuous low grade fever and left flank pain. After having a CT of abdomen/pelvis completed,as order by urology, she was found to have a left renal abscess. Shortly after being admitted and consulting IR, Jacqueline Finley underwent drainage. Following her discharge on 08/12/21, she  was prescribed bactrim 160 mg every 12 hours x 10 days  BID and record daily output. Additionally she was told to f/u with urology and her PCP.   Currently presents as recommended by Urology and reports she has been feeling well. She has followed up with Urology and has an upcoming appointment this month. She is managing well at this time.   Pleural effusion Incidental finding on CT abd/pel on 08/31/20. She is having a repeat CT scan with her urologist next month for further evaluation. Denies SOB, chest pain, prior lung issues.  Hypertension Jacqueline Finley is compliant with hydrochlorothiazide 25 mg daily with no adverse effects. Although she has been compliant, she was informed she would need to change this medication due to current kidney issues. At this time she is on board with changing the medication, but wants something that won't make her dizzy. Patient denies chest pain, SOB, blurred vision, unusual headaches, lower leg swelling. Denies excessive caffeine intake, stimulant usage, excessive alcohol intake, or increase in salt consumption.  BP Readings from Last 3 Encounters:  09/29/21 (!) 130/92  08/15/21 (!) 147/86  08/02/21 130/90   Oral Thrush States that as she was going through her kidney issues, she began experiencing oral thrush. States it has made it hard to eat certain foods due to them not tasting right, mainly protein.   Past Medical History:  Diagnosis Date   Acute angle-closure glaucoma 2015   from scopalamine patch   Depression    Hypothyroidism    PONV (postoperative nausea and vomiting)  Social History   Tobacco Use   Smoking status: Never   Smokeless tobacco: Never  Vaping Use   Vaping Use: Never used  Substance Use Topics   Alcohol use: Yes    Comment: occ   Drug use: No    Past Surgical History:  Procedure Laterality Date   CESAREAN SECTION     CYSTOSCOPY WITH RETROGRADE PYELOGRAM, URETEROSCOPY AND STENT PLACEMENT Left 07/31/2021   Procedure:  CYSTOSCOPY WITH RETROGRADE PYELOGRAM, URETEROSCOPY AND STENT PLACEMENT;  Surgeon: [name], MD;  Location: [facility];  Service: Urology;  Laterality: Left;   INCISION AND DRAINAGE     due to C-section   IR RADIOLOGIST EVAL & MGMT  08/31/2021   ORIF FINGER FRACTURE Right    4th digit   PARATHYROIDECTOMY      Family History  Problem Relation Age of Onset   Uterine cancer Mother 49   Heart attack Father    Hypertension Father    Prostate cancer Father    Colon cancer Neg Hx    Breast cancer Neg Hx     Allergies  Allergen Reactions   Morphine Nausea Only   Scopolamine Other (See Comments)    Accute angle closure glaucoma    Current Medications:   Current Outpatient Medications:    amLODipine (NORVASC) 5 MG tablet, Take 1 tablet (5 mg total) by mouth daily., Disp: 90 tablet, Rfl: 1   bimatoprost (LATISSE) 0.03 % ophthalmic solution, Place 1 application into both eyes See admin instructions. Apply topically along upper eyelid margin at base of eyelashes daily at bedtime, Disp: , Rfl:    cephALEXin (KEFLEX) 250 MG capsule, Take 250 mg by mouth at bedtime., Disp: , Rfl:    fluconazole (DIFLUCAN) 150 MG tablet, Take 1 tablet (150 mg total) by mouth once for 1 dose., Disp: 1 tablet, Rfl: 0   hydrochlorothiazide (HYDRODIURIL) 25 MG tablet, TAKE 1 TABLET BY MOUTH EVERY DAY IN THE MORNING (Patient taking differently: Take 25 mg by mouth every morning.), Disp: 90 tablet, Rfl: 3   ibuprofen (ADVIL) 800 MG tablet, Take 800 mg by mouth 2 (two) times daily as needed., Disp: , Rfl:    nystatin (MYCOSTATIN) 100000 UNIT/ML suspension, Take 5 mLs by mouth every 6 (six) hours., Disp: , Rfl:    SYNTHROID 112 MCG tablet, TAKE 1 TABLET BY MOUTH EVERY MORNING ON AN EMPTY STOMACH WITH A GLASS OF WATER AT LEAST 30 TO 60 MINUTES BEFORE BREAKFAST (Patient taking differently: Take 112 mcg by mouth daily before breakfast.), Disp: 90 tablet, Rfl: 3   venlafaxine XR (EFFEXOR-XR) 150 MG 24 hr capsule, Take 1 capsule  (150 mg total) by mouth 2 (two) times daily. (Patient taking differently: Take 300 mg by mouth every morning.), Disp: 180 capsule, Rfl: 3   Review of Systems:   ROS Negative unless otherwise specified per HPI.  Vitals:   Vitals:   09/29/21 0805  BP: (!) 130/92  Pulse: 74  Temp: 98.2 F (36.8 C)  TempSrc: Temporal  SpO2: 97%  Weight: 151 lb 8 oz (68.7 kg)  Height: 5\' 4"  (1.626 m)     Body mass index is 26 kg/m.  Physical Exam:   Physical Exam Vitals and nursing note reviewed.  Constitutional:      General: She is not in acute distress.    Appearance: She is well-developed. She is not ill-appearing or toxic-appearing.  Cardiovascular:     Rate and Rhythm: Normal rate and regular rhythm.     Pulses: Normal  pulses.     Heart sounds: Normal heart sounds, S1 normal and S2 normal.  Pulmonary:     Effort: Pulmonary effort is normal.     Breath sounds: Normal breath sounds.  Skin:    General: Skin is warm and dry.  Neurological:     Mental Status: She is alert.     GCS: GCS eye subscore is 4. GCS verbal subscore is 5. GCS motor subscore is 6.  Psychiatric:        Speech: Speech normal.        Behavior: Behavior normal. Behavior is cooperative.    Assessment and Plan:   Renal Abscess Stable No red flags on exam Managed by Dr. Jeffie Pollock, Urology  Primary Hypertension Overall controlled Stop HCTZ 25 mg daily and start Norvasc 5 mg daily  Monitor BP regularly at home, 1-2 a week I advised patient that if BP readings are consistently >150/90 to reach out and medication will be adjusted accordingly Follow-up in 3-6 months, sooner if concerns  Oral Thrush Continue nystatin mouthwash Oral diflucan also sent in for patient Follow up as needed  Pleural effusion No red flags Follow-up CT scan already scheduled I advised her to let us know if she needs pulm referral  I,Havlyn C Ratchford,acting as a scribe for [employer], PA.,have documented all relevant  documentation on the behalf of Inda Coke, PA,as directed by  Inda Coke, PA while in the presence of Inda Coke, [place].  I, Inda Coke, [place], have reviewed all documentation for this visit. The documentation on 09/29/21 for the exam, diagnosis, procedures, and orders are all accurate and complete.  Inda Coke, PA-C

## 2021-09-29 ENCOUNTER — Encounter: Payer: Self-pay | Admitting: Physician Assistant

## 2021-09-29 ENCOUNTER — Other Ambulatory Visit: Payer: Self-pay

## 2021-09-29 ENCOUNTER — Ambulatory Visit (INDEPENDENT_AMBULATORY_CARE_PROVIDER_SITE_OTHER): Payer: 59 | Admitting: Physician Assistant

## 2021-09-29 VITALS — BP 130/92 | HR 74 | Temp 98.2°F | Ht 64.0 in | Wt 151.5 lb

## 2021-09-29 DIAGNOSIS — I10 Essential (primary) hypertension: Secondary | ICD-10-CM | POA: Diagnosis not present

## 2021-09-29 DIAGNOSIS — J90 Pleural effusion, not elsewhere classified: Secondary | ICD-10-CM

## 2021-09-29 DIAGNOSIS — N151 Renal and perinephric abscess: Secondary | ICD-10-CM | POA: Diagnosis not present

## 2021-09-29 DIAGNOSIS — B370 Candidal stomatitis: Secondary | ICD-10-CM

## 2021-09-29 MED ORDER — FLUCONAZOLE 150 MG PO TABS: 150.0000 mg | ORAL_TABLET | Freq: Once | ORAL | 0 refills | Status: AC

## 2021-09-29 MED ORDER — AMLODIPINE BESYLATE 5 MG PO TABS: 5.0000 mg | ORAL_TABLET | Freq: Every day | ORAL | 1 refills | Status: DC

## 2021-10-24 ENCOUNTER — Encounter: Payer: Self-pay | Admitting: Physician Assistant

## 2021-10-25 ENCOUNTER — Other Ambulatory Visit: Payer: Self-pay | Admitting: Physician Assistant

## 2021-10-25 DIAGNOSIS — R002 Palpitations: Secondary | ICD-10-CM

## 2021-10-31 ENCOUNTER — Encounter: Payer: Self-pay | Admitting: Physician Assistant

## 2021-11-01 ENCOUNTER — Encounter: Payer: Self-pay | Admitting: Physician Assistant

## 2021-11-02 MED ORDER — FLUCONAZOLE 150 MG PO TABS: ORAL_TABLET | ORAL | 0 refills | Status: DC

## 2021-11-07 ENCOUNTER — Other Ambulatory Visit: Payer: Self-pay

## 2021-11-07 ENCOUNTER — Ambulatory Visit (HOSPITAL_BASED_OUTPATIENT_CLINIC_OR_DEPARTMENT_OTHER): Payer: 59 | Admitting: Cardiology

## 2021-11-07 ENCOUNTER — Encounter (HOSPITAL_BASED_OUTPATIENT_CLINIC_OR_DEPARTMENT_OTHER): Payer: Self-pay | Admitting: Cardiology

## 2021-11-07 VITALS — BP 126/84 | HR 74 | Ht 64.0 in | Wt 152.0 lb

## 2021-11-07 DIAGNOSIS — I10 Essential (primary) hypertension: Secondary | ICD-10-CM

## 2021-11-07 DIAGNOSIS — Z7189 Other specified counseling: Secondary | ICD-10-CM | POA: Diagnosis not present

## 2021-11-07 DIAGNOSIS — R42 Dizziness and giddiness: Secondary | ICD-10-CM

## 2021-11-07 DIAGNOSIS — R002 Palpitations: Secondary | ICD-10-CM

## 2021-11-07 DIAGNOSIS — R5382 Chronic fatigue, unspecified: Secondary | ICD-10-CM

## 2021-11-07 NOTE — Patient Instructions (Signed)

## 2021-11-07 NOTE — Progress Notes (Signed)
Cardiology Office Note:    Date:  11/07/2021   ID:  Jacqueline Finley, DOB 13-Nov-1965, MRN [id number]  PCP:  Inda Coke, [facility]  Cardiologist:  Buford Dresser, MD  Referring MD: Inda Coke, [place]   CC: new patient consultation for palpitations  History of Present Illness:    Jacqueline Finley is a 56 y.o. female with a hx of hypertension who is seen as a new consult at the request of Inda Coke, [place] for the evaluation and management of palpitations. She contacted Dr. Morene Rankins 10/24/21 complaining of dizziness lasting for 2 days. Her blood pressure was elevated at 130s over 80s. She was initially taking HCTZ with no adverse effects but was switched to amlodipine due to recent renal abscess.   Today: She is doing well. However, she has been feeling dizzy. She noticed the dizziness started after starting amlodipine. The dizziness was most noticeable when she was doing exercising requiring her to stand up and down. The dizziness progressed to occurring all the time. She also endorses associated fatigue. She has been unable to exercise and perform daily activities due to the dizziness and fatigue. Of note, she takes amlodipine at night. If not the amlodipine, she wonders if the symptoms are related to Diflucan. She endorses intermittent oral thrush lasting for the past few months.   She endorses occasional palpitations the occur primarily at night. She had an episode at work that lasted for several hours. Her Apple Watch notified her of having an arrhythmia. However, she has not had a recent palpitation episode.  She also has episodes of hot flashes and sweating. She does not relate these symptoms to menopause.   Occasionally, her bilateral hands will swell.  She checks her blood pressure multiple times daily. While on HCTZ, her pressure was 130s over 80s. However, she reports her pressure was still mildly elevated. She missed a dose over the weekend and her blood pressure increased to  170/102.  Of note, she has a follow-up CT with the urologist on the 20th. She works in a dialysis center.  Denies chest pain, shortness of breath at rest or with normal exertion. No PND, orthopnea or unexpected weight gain. No syncope.  Past Medical History:  Diagnosis Date   Acute angle-closure glaucoma 2015   from scopalamine patch   Depression    Hypothyroidism    PONV (postoperative nausea and vomiting)     Past Surgical History:  Procedure Laterality Date   CESAREAN SECTION     CYSTOSCOPY WITH RETROGRADE PYELOGRAM, URETEROSCOPY AND STENT PLACEMENT Left 07/31/2021   Procedure: CYSTOSCOPY WITH RETROGRADE PYELOGRAM, URETEROSCOPY AND STENT PLACEMENT;  Surgeon: [name], MD;  Location: [facility];  Service: Urology;  Laterality: Left;   INCISION AND DRAINAGE     due to C-section   IR RADIOLOGIST EVAL & MGMT  08/31/2021   ORIF FINGER FRACTURE Right    4th digit   PARATHYROIDECTOMY      Current Medications: Current Outpatient Medications on File Prior to Visit  Medication Sig   amLODipine (NORVASC) 5 MG tablet Take 1 tablet (5 mg total) by mouth daily.   bimatoprost (LATISSE) 0.03 % ophthalmic solution Place 1 application into both eyes See admin instructions. Apply topically along upper eyelid margin at base of eyelashes daily at bedtime   cephALEXin (KEFLEX) 250 MG capsule Take 250 mg by mouth at bedtime.   fluconazole (DIFLUCAN) 150 MG tablet Take one tablet by mouth and repeat in 3 days.   ibuprofen (ADVIL) 800 MG tablet Take  800 mg by mouth 2 (two) times daily as needed.   SYNTHROID 112 MCG tablet TAKE 1 TABLET BY MOUTH EVERY MORNING ON AN EMPTY STOMACH WITH A GLASS OF WATER AT LEAST 30 TO 60 MINUTES BEFORE BREAKFAST (Patient taking differently: Take 112 mcg by mouth daily before breakfast.)   venlafaxine XR (EFFEXOR-XR) 150 MG 24 hr capsule Take 1 capsule (150 mg total) by mouth 2 (two) times daily. (Patient taking differently: Take 300 mg by mouth every morning.)   No current  facility-administered medications on file prior to visit.     Allergies:   Morphine and Scopolamine   Social History   Tobacco Use   Smoking status: Never   Smokeless tobacco: Never  Vaping Use   Vaping Use: Never used  Substance Use Topics   Alcohol use: Yes    Comment: occ   Drug use: No    Family History: family history includes Heart attack in her father; Hypertension in her father; Prostate cancer in her father; Uterine cancer (age of onset: 17) in her mother. There is no history of Colon cancer or Breast cancer.  ROS:   Please see the history of present illness.  Additional pertinent ROS:  (+) Dizziness (+) Fatigue/Malaise (+) Palpitations (+) Hot flashes (+) Sweating (+) Bilateral hand swelling   EKGs/Labs/Other Studies Reviewed:    The following studies were reviewed today: CT Abdomen 08/31/21 Resolved left upper pole renal abscess after percutaneous catheter drainage. Small left pleural effusion may be sympathetic. Associated mild left basilar atelectasis.  CTA Chest 08/11/21 CTA of the chest: No evidence of acute pulmonary emboli.   Mild emphysematous changes without acute infiltrate.   CT of the abdomen and pelvis: In the upper pole of the left kidney, there is an irregular fluid collection identified consistent with focal renal abscess. This is similar to that seen on prior exam but better delineated due to the contrast enhancement. Additionally a small subcapsular fluid collection is noted inferiorly as well as multiple areas of decreased enhancement on delayed images. These changes are consistent with pyelonephritis.   Uterine fibroid disease.   Diverticulosis without diverticulitis.   Tiny nonobstructing right renal stone stable from the previous day.  EKG:  EKG is personally reviewed.   11/07/21: NST at 74 bpm with nonspecific T wave pattern  Recent Labs: 06/30/2021: TSH 2.05 08/01/2021: Magnesium 2.3 08/12/2021: ALT 85 08/14/2021: BUN 7;  Creatinine, Ser 0.56; Hemoglobin 11.1; Platelets 505; Potassium 3.5; Sodium 136  Recent Lipid Panel    Component Value Date/Time   CHOL 212 (H) 06/30/2021 1536   TRIG 140 06/30/2021 1536   HDL 47 (L) 06/30/2021 1536   CHOLHDL 4.5 06/30/2021 1536   VLDL 20.2 10/24/2018 0747   LDLCALC 139 (H) 06/30/2021 1536    Physical Exam:    VS:  BP 126/84    Pulse 74    Ht 5\' 4"  (1.626 m)    Wt 152 lb (68.9 kg)    SpO2 99%    BMI 26.09 kg/m     Wt Readings from Last 3 Encounters:  11/07/21 152 lb (68.9 kg)  09/29/21 151 lb 8 oz (68.7 kg)  08/12/21 168 lb 6.9 oz (76.4 kg)    GEN: Well nourished, well developed in no acute distress HEENT: Normal, moist mucous membranes NECK: No JVD CARDIAC: regular rhythm, normal S1 and S2, no rubs or gallops. No murmur. VASCULAR: Radial and DP pulses 2+ bilaterally. No carotid bruits RESPIRATORY:  Clear to auscultation without rales, wheezing or  rhonchi  ABDOMEN: Soft, non-tender, non-distended MUSCULOSKELETAL:  Ambulates independently SKIN: Warm and dry, no edema NEUROLOGIC:  Alert and oriented x 3. No focal neuro deficits noted. PSYCHIATRIC:  Normal affect    ASSESSMENT:    1. Palpitation   2. Primary hypertension   3. Cardiac risk counseling   4. Counseling on health promotion and disease prevention   5. Nonspecific dizziness   6. Chronic fatigue    PLAN:    Palpitations -I reviewed her apple watch tracing--atrial bigeminy 10/24/21 -we discussed the potential causes of fast heart rates and palpitations today. Reviewed the normal electrical system of the heart. Reviewed the role of the sinus node. Reviewed the balance between resting (vagal) tone and fight or flight nervous system input. Reviewed how exercise improves vagal tone and lowers resting heart rate. Reviewed that sinus tachycardia, or elevated sinus rate, is usually secondary to something else in the body. This can include pain, stress, infection, anxiety, hormone imbalance, low blood  counts, etc. Discussed that we do not typically treat sinus tachycardia by itself, and instead the focus is on finding what is driving the heart rate and treating that. We discussed that there can be other rhythm issues, from either the top or bottom of the heart, that are abnormal rhythms. Discussed how we evaluate for these.  -we discussed options for monitoring. Given that these events have decreased in frequency, will hold on event monitor today. She can take strips on her apple watch and send if symptoms recur -reviewed red flag warning signs that need immediate medical attention  Hypertension: has been working on this with Inda Coke. Complicated as she had recent renal abscess. Did well on HCTZ in the past. Has upcoming follow up imaging on kidneys. If abscess resolved and renal function stable, she would like to go back to HCTZ, which would be reasonable. She feels amlodipine does not agree with her as well as HCTZ did. Goal <130/80.  Dizziness, fatigue: unclear to me what is the etiology of these symptoms, but low suspicion for cardiac cause  Cardiac risk counseling and prevention recommendations: -recommend heart healthy/Mediterranean diet, with whole grains, fruits, vegetable, fish, lean meats, nuts, and olive oil. Limit salt. -recommend moderate walking, 3-5 times/week for 30-50 minutes each session. Aim for at least 150 minutes.week. Goal should be pace of 3 miles/hours, or walking 1.5 miles in 30 minutes -recommend avoidance of tobacco products. Avoid excess alcohol. -ASCVD risk score: The 10-year ASCVD risk score (Arnett DK, et al., 2019) is: 3.8%   Values used to calculate the score:     Age: 37 years     Sex: Female     Is Non-Hispanic African American: No     Diabetic: No     Tobacco smoker: No     Systolic Blood Pressure: 366 mmHg     Is BP treated: Yes     HDL Cholesterol: 47 mg/dL     Total Cholesterol: 212 mg/dL    Plan for follow up: PRN  Buford Dresser,  MD, PhD, [facility]    Medication Adjustments/Labs and Tests Ordered: Current medicines are reviewed at length with the patient today.  Concerns regarding medicines are outlined above.  Orders Placed This Encounter  Procedures   EKG 12-Lead   No orders of the defined types were placed in this encounter.   Patient Instructions  Medication Instructions:  Your Physician recommend you continue on your current medication as directed.    *If  you need a refill on your cardiac medications before your next appointment, please call your pharmacy*   Lab Work: None ordered today   Testing/Procedures: None ordered today   Follow-Up: At [facility], you and your health needs are our priority.  As part of our continuing mission to provide you with exceptional heart care, we have created designated Provider Care Teams.  These Care Teams include your primary Cardiologist (physician) and Advanced Practice Providers (APPs -  Physician Assistants and Nurse Practitioners) who all work together to provide you with the care you need, when you need it.  We recommend signing up for the patient portal called "MyChart".  Sign up information is provided on this After Visit Summary.  MyChart is used to connect with patients for Virtual Visits (Telemedicine).  Patients are able to view lab/test results, encounter notes, upcoming appointments, etc.  Non-urgent messages can be sent to your provider as well.   To learn more about what you can do with MyChart, go to [email or web address].    Your next appointment:   As needed  The format for your next appointment:   In Person  Provider:   Buford Dresser, MD       Wilhemina Bonito as a scribe for Buford Dresser, MD.,have documented all relevant documentation on the behalf of Buford Dresser, MD,as directed by  Buford Dresser, MD while in the presence of Buford Dresser, MD.  I,  Buford Dresser, MD, have reviewed all documentation for this visit. The documentation on 11/10/21 for the exam, diagnosis, procedures, and orders are all accurate and complete.   Signed, Buford Dresser, MD PhD 11/07/2021     [place]

## 2022-01-03 ENCOUNTER — Telehealth: Payer: Self-pay | Admitting: Physician Assistant

## 2022-01-03 MED ORDER — AMLODIPINE BESYLATE 5 MG PO TABS: 5.0000 mg | ORAL_TABLET | Freq: Every day | ORAL | 1 refills | Status: DC

## 2022-01-03 NOTE — Telephone Encounter (Signed)
Called [facility] and spoke to [place], asked her if medications Amlodipine and Venlafaxine needs PA's cause we did not receive anything? Samia said, PA was sent today for Venlafaxine and Amlodipine just needs new Rx for refills. Told her okay will send Rx for Amlodipine and will look out for PA. Samia verbalized understanding. ?

## 2022-01-03 NOTE — Telephone Encounter (Signed)
Spoke to pt told her I contacted the pharmacy and your Amlodipine just needed a new Rx sent over. I sent it so should be ready later today. Pt verbalized understanding. In regards to the Venlafaxine they are sending over paperwork for PA due to amount you are taking. Asked pt if she is taking 300 mg daily? Pt said yes, has been for 20 some years, since 2001. Told pt okay as soon as I hear back from insurance will let her know. Pt verbalized understanding. ?

## 2022-01-03 NOTE — Telephone Encounter (Signed)
Pt is stating pharmacy has contacted [place] regarding these meds. They stated the PA for Amlodipine was never processed and Venlafaxine was denied. Please advise ? ?.. ?Encourage patient to contact the pharmacy for refills or they can request refills through [facility] ? ?LAST APPOINTMENT DATE:  10/09/21 ? ?NEXT APPOINTMENT DATE: N/A ? ?MEDICATION:amLODipine (NORVASC) 5 MG tablet ? ?venlafaxine XR (EFFEXOR-XR) 150 MG 24 hr capsule ?Is the patient out of medication?  ? ?PHARMACY: ?[facility] [place], [place] [address] Phone:  [phone]  ?Fax:  [phone]  ?  ? ? ?Let patient know to contact pharmacy at the end of the day to make sure medication is ready. ? ?Please notify patient to allow 48-72 hours to process  ?

## 2022-01-05 MED ORDER — VENLAFAXINE HCL ER 150 MG PO CP24: 150.0000 mg | ORAL_CAPSULE | Freq: Two times a day (BID) | ORAL | 0 refills | Status: DC

## 2022-01-05 NOTE — Telephone Encounter (Signed)
Pt called wanting to know if we can send small amount of Venlafaxine to the pharmacy for her till PA is approved? Told pt sure, how many do you want? Pt said one weeks worth. Told her okay will send to pharmacy and put note that you will pay out of pocket. Pt verbalized understanding. Rx sent. ?

## 2022-01-05 NOTE — Addendum Note (Signed)
Addended by: Marian Sorrow on: 01/05/2022 04:15 PM ? ? Modules accepted: Orders ? ?

## 2022-01-05 NOTE — Telephone Encounter (Signed)
PA for Venlafaxine was started today thru Covermymeds, awaiting response. Key: [id number] ?

## 2022-01-08 NOTE — Telephone Encounter (Signed)
Spoke to pt told her PA was approved for Venlafaxine and I will contact the pharmacy and should be ready later today. Pt verbalized understanding. ? ?[facility] and spoke to [place] told her PA for Venlafaxine was approved by insurance. Samai verbalized understanding and said they received an e-mail from insurance.Told her okay. ?

## 2022-01-08 NOTE — Telephone Encounter (Signed)
Received response from Covermymeds: ?Approved on April 14 ?SLPNPY:[id number];ZNBVAP:[id number];Review Type:Prior Auth;Coverage Start Date:12/06/2021;Coverage End Date:01/05/2023; ?

## 2022-01-09 ENCOUNTER — Other Ambulatory Visit: Payer: Self-pay | Admitting: Physician Assistant

## 2022-04-12 IMAGING — CT CT IMAGE GUIDED DRAINAGE BY PERCUTANEOUS CATHETER
4 of 8 series · 10 of 32 positions shown, 15 images · non-contrast
Comparison: none

INDICATION: 55-year-old female with a left renal abscess referred for drainage
TECHNIQUE: Informed written consent was obtained from the patient after a
thorough discussion of the procedural risks, benefits and
alternatives. All questions were addressed. Maximal Sterile Barrier
Technique was utilized including caps, mask, sterile gowns, sterile
gloves, sterile drape, hand hygiene and skin antiseptic. A timeout
was performed prior to the initiation of the procedure.

[Series 2: i-spiral 5.0 bf37 · axial · 0.83mm/px · z∈[+1379,+1526]mm · 4 of 71 slices shown, 9 images (1 of 4)]
[im 15/71  soft-tissue]
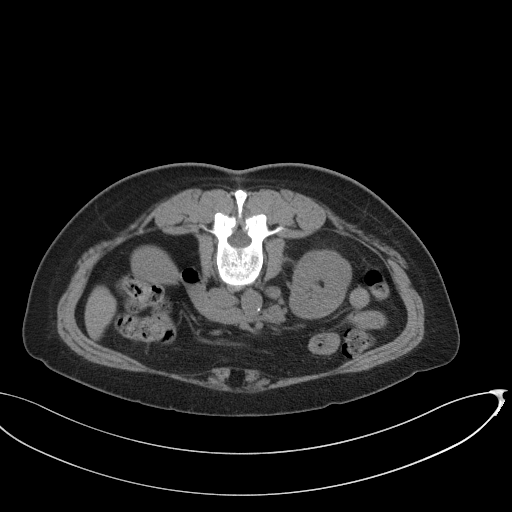
[im 15/71  lung]
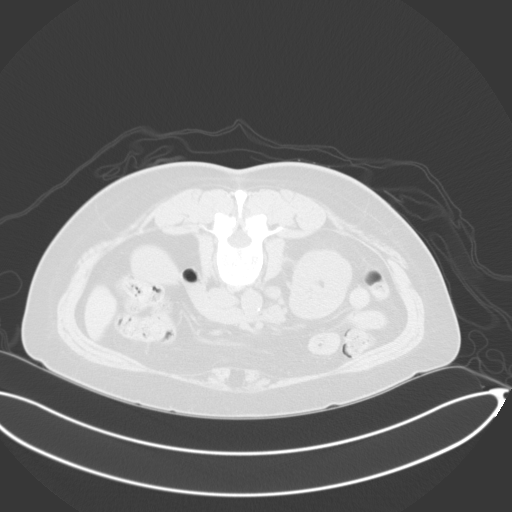
[im 15/71  bone]
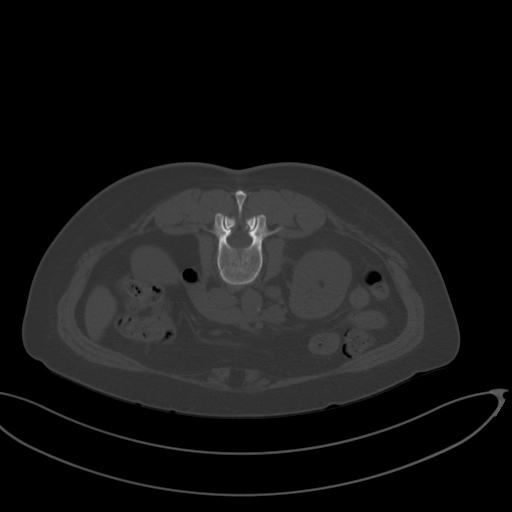
[im 29/71  soft-tissue]
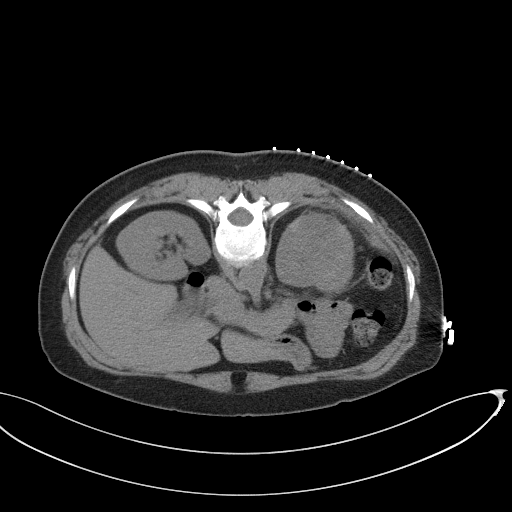
[im 29/71  lung]
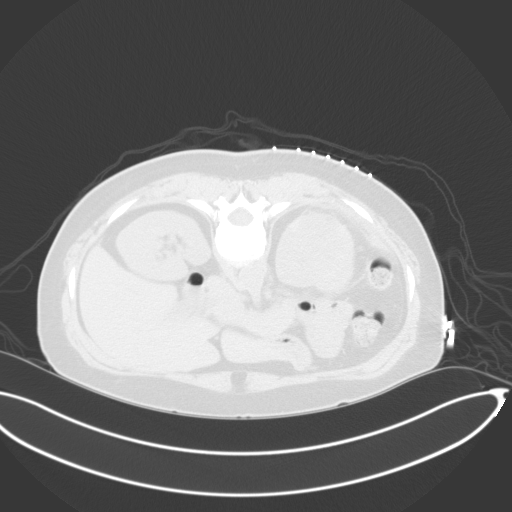
[im 43/71  soft-tissue]
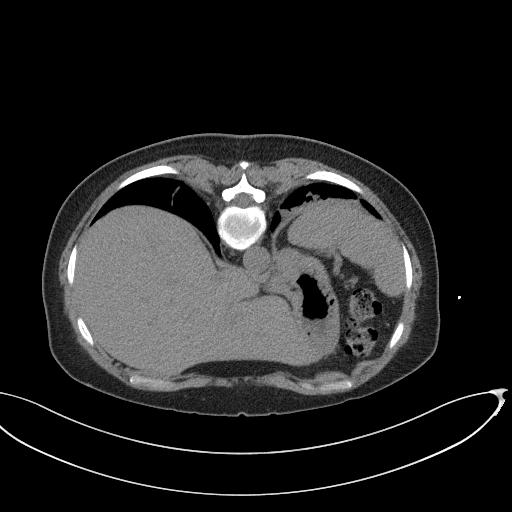
[im 43/71  lung]
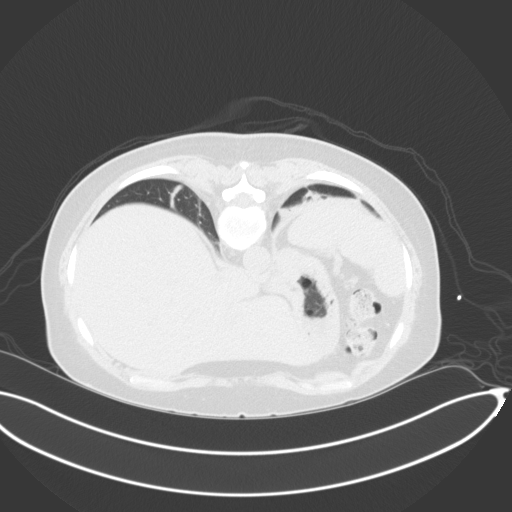
[im 57/71  soft-tissue]
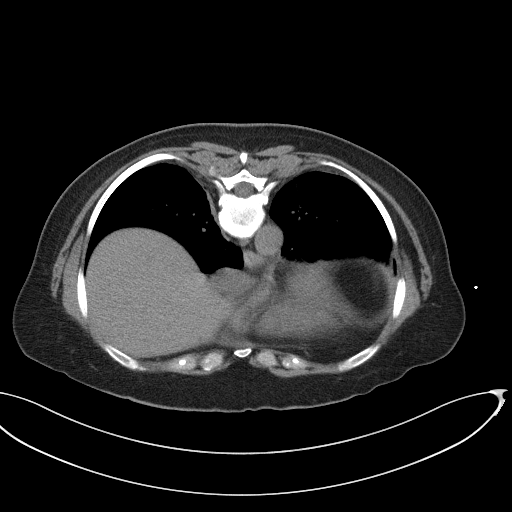
[im 57/71  lung]
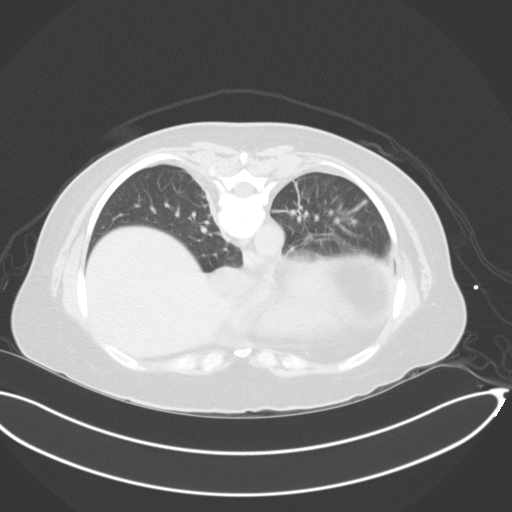

[Series 5: i-spiral 5.0 bf37 · axial · 0.83mm/px · z∈[+1380,+1426]mm · 2 of 40 slices shown (2 of 4)]
[im 14/40  soft-tissue]
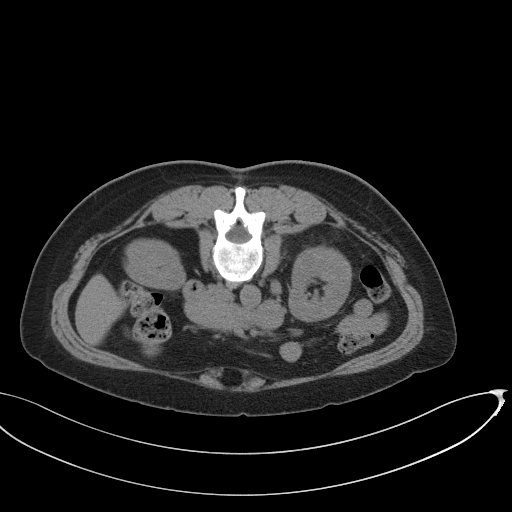
[im 27/40  soft-tissue]
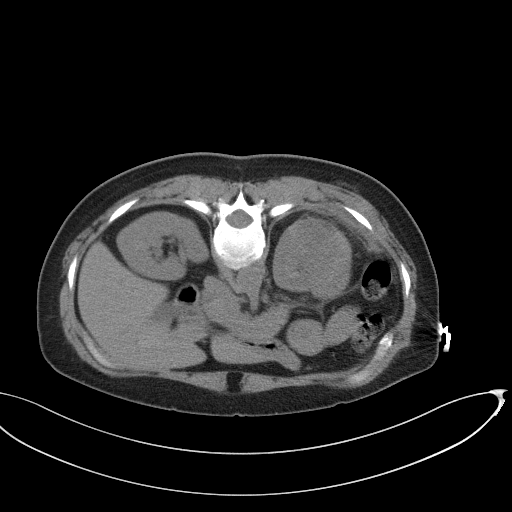

[Series 6: i-spiral 5.0 bf37 · axial · 0.83mm/px · z∈[+1380,+1426]mm · 2 of 40 slices shown (3 of 4)]
[im 14/40  soft-tissue]
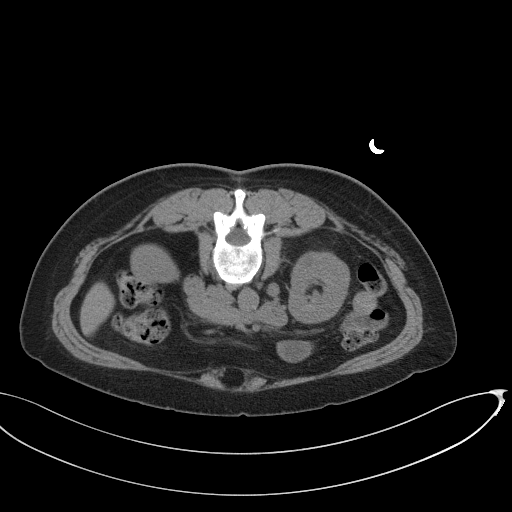
[im 27/40  soft-tissue]
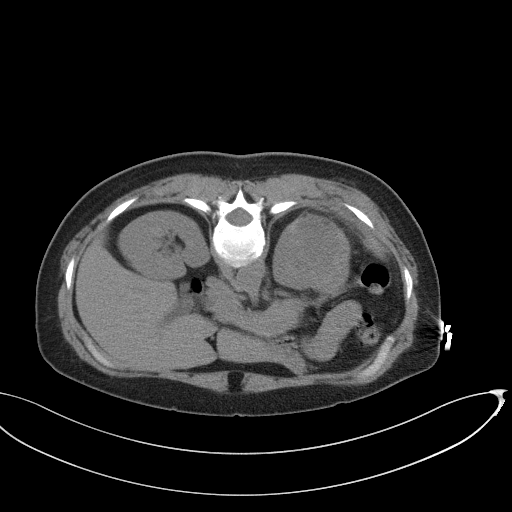

[Series 7: i-spiral 5.0 bf37 · axial · 0.83mm/px · z∈[+1380,+1426]mm · 2 of 40 slices shown (4 of 4)]
[im 14/40  soft-tissue]
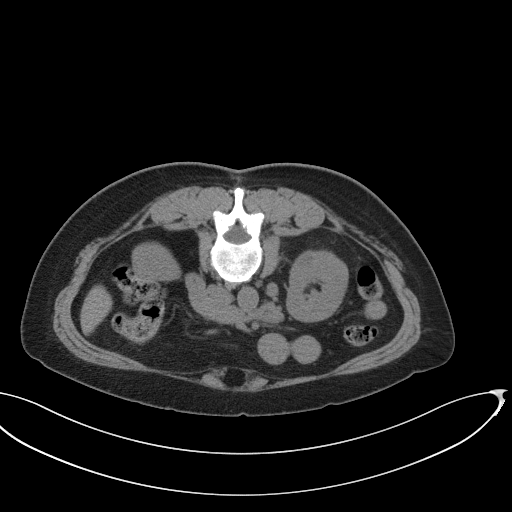
[im 27/40  soft-tissue]
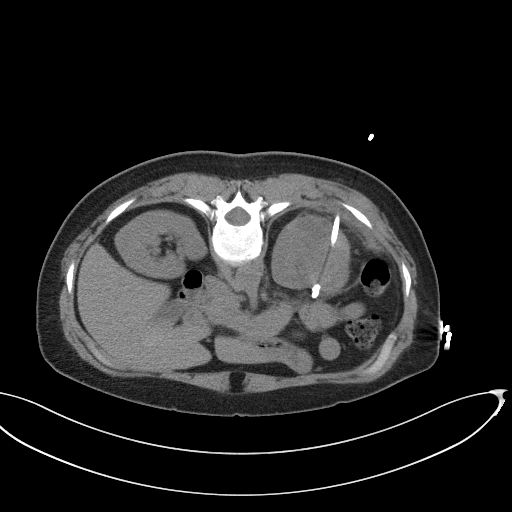

[10 of 32 positions shown; findings below may reference images not displayed]

EXAM:
CT GUIDED DRAINAGE OF  ABSCESS

MEDICATIONS:
The patient is currently admitted to the hospital and receiving
intravenous antibiotics. The antibiotics were administered within an
appropriate time frame prior to the initiation of the procedure.

ANESTHESIA/SEDATION:
2.0 mg IV Versed 100 mcg IV Fentanyl

Moderate Sedation Time:  16 minutes

The patient was continuously monitored during the procedure by the
interventional radiology nurse under my direct supervision.

COMPLICATIONS:
None
PROCEDURE:
Patient was position prone position on the CT gantry table and a
scout CT of the upper abdomen was performed. The left flank was
prepped with chlorhexidine in a sterile fashion, and a sterile drape
was applied covering the operative field. A sterile gown and sterile
gloves were used for the procedure. Local anesthesia was provided
with 1% Lidocaine.

Once the patient is prepped and draped in the usual sterile fashion
and the skin was anesthetized overlying the left kidney, a Yueh
needle was advanced with CT guidance from just adjacent to the left
twelfth rib, directed in a cephalad trajectory, in an effort to
avoid the lowest margin of the diaphragm. Given the position of the
abscess at the superior pole of the left kidney, this cephalad
trajectory was necessary.

Once complex fluid was aspirated from the Yueh needle, modified
Seldinger technique was used to place a 12 French drain into the
abscess. Aspiration of purulent material was sent for culture. The
drain was attached to bulb suction. Drain was sutured in position.

Patient tolerated the procedure well and remained hemodynamically
stable throughout.

No complications were encountered and no significant blood loss.
FINDINGS: Twelve French drain into the superior position abscess within the
left renal cortex.
IMPRESSION: Status post CT-guided drainage of left renal abscess at the superior
pole.

## 2022-06-18 ENCOUNTER — Encounter: Payer: Self-pay | Admitting: *Deleted

## 2022-07-03 ENCOUNTER — Other Ambulatory Visit: Payer: Self-pay | Admitting: Physician Assistant

## 2022-07-27 ENCOUNTER — Telehealth: Payer: Self-pay | Admitting: Physician Assistant

## 2022-07-27 MED ORDER — AMLODIPINE BESYLATE 5 MG PO TABS: 5.0000 mg | ORAL_TABLET | Freq: Every day | ORAL | 1 refills | Status: DC

## 2022-07-27 NOTE — Telephone Encounter (Signed)
  LAST APPOINTMENT DATE:  01/03/22  NEXT APPOINTMENT DATE: 08/23/22--cpe  MEDICATION: amLODipine (NORVASC) 5 MG tablet   Is the patient out of medication? Yes  PHARMACY:  [facility] [place], [place] - [address] [place] [address] Phone: [phone]  Fax: [phone]    Patient states she tried to fill med sooner but it got denied by pharmacy.

## 2022-07-27 NOTE — Telephone Encounter (Signed)
Spoke to pt told her Rx for Amlodipine sent to pharmacy as requested. Pt verbalized understanding.

## 2022-08-07 ENCOUNTER — Encounter: Payer: 59 | Admitting: Physician Assistant

## 2022-08-23 ENCOUNTER — Ambulatory Visit (INDEPENDENT_AMBULATORY_CARE_PROVIDER_SITE_OTHER): Payer: 59 | Admitting: Physician Assistant

## 2022-08-23 ENCOUNTER — Encounter: Payer: Self-pay | Admitting: Physician Assistant

## 2022-08-23 VITALS — BP 110/70 | HR 73 | Temp 97.7°F | Ht 64.0 in | Wt 144.0 lb

## 2022-08-23 DIAGNOSIS — E039 Hypothyroidism, unspecified: Secondary | ICD-10-CM | POA: Diagnosis not present

## 2022-08-23 DIAGNOSIS — Z1211 Encounter for screening for malignant neoplasm of colon: Secondary | ICD-10-CM

## 2022-08-23 DIAGNOSIS — Z0000 Encounter for general adult medical examination without abnormal findings: Secondary | ICD-10-CM | POA: Diagnosis not present

## 2022-08-23 DIAGNOSIS — E559 Vitamin D deficiency, unspecified: Secondary | ICD-10-CM | POA: Diagnosis not present

## 2022-08-23 DIAGNOSIS — I10 Essential (primary) hypertension: Secondary | ICD-10-CM

## 2022-08-23 LAB — CBC WITH DIFFERENTIAL/PLATELET
Basophils Absolute: 0.1 10*3/uL (ref 0.0–0.1)
Basophils Relative: 1 % (ref 0.0–3.0)
Eosinophils Absolute: 0.2 10*3/uL (ref 0.0–0.7)
Eosinophils Relative: 3.4 % (ref 0.0–5.0)
HCT: 41.5 % (ref 36.0–46.0)
Hemoglobin: 13.9 g/dL (ref 12.0–15.0)
Lymphocytes Relative: 30.9 % (ref 12.0–46.0)
Lymphs Abs: 1.7 10*3/uL (ref 0.7–4.0)
MCHC: 33.6 g/dL (ref 30.0–36.0)
MCV: 85 fl (ref 78.0–100.0)
Monocytes Absolute: 0.5 10*3/uL (ref 0.1–1.0)
Monocytes Relative: 8.6 % (ref 3.0–12.0)
Neutro Abs: 3 10*3/uL (ref 1.4–7.7)
Neutrophils Relative %: 56.1 % (ref 43.0–77.0)
Platelets: 338 10*3/uL (ref 150.0–400.0)
RBC: 4.88 Mil/uL (ref 3.87–5.11)
RDW: 13.8 % (ref 11.5–15.5)
WBC: 5.4 10*3/uL (ref 4.0–10.5)

## 2022-08-23 LAB — COMPREHENSIVE METABOLIC PANEL
ALT: 26 U/L (ref 0–35)
AST: 22 U/L (ref 0–37)
Albumin: 4.5 g/dL (ref 3.5–5.2)
Alkaline Phosphatase: 48 U/L (ref 39–117)
BUN: 18 mg/dL (ref 6–23)
CO2: 30 mEq/L (ref 19–32)
Calcium: 9.5 mg/dL (ref 8.4–10.5)
Chloride: 103 mEq/L (ref 96–112)
Creatinine, Ser: 0.58 mg/dL (ref 0.40–1.20)
GFR: 99.68 mL/min (ref >60.00)
Glucose, Bld: 88 mg/dL (ref 70–99)
Potassium: 4.5 mEq/L (ref 3.5–5.1)
Sodium: 138 mEq/L (ref 135–145)
Total Bilirubin: 0.4 mg/dL (ref 0.2–1.2)
Total Protein: 7.1 g/dL (ref 6.0–8.3)

## 2022-08-23 LAB — LIPID PANEL
Cholesterol: 234 mg/dL — ABNORMAL HIGH (ref 0–200)
HDL: 70.1 mg/dL (ref >39.00)
LDL Cholesterol: 142 mg/dL — ABNORMAL HIGH (ref 0–99)
NonHDL: 163.53
Total CHOL/HDL Ratio: 3
Triglycerides: 107 mg/dL (ref 0.0–149.0)
VLDL: 21.4 mg/dL (ref 0.0–40.0)

## 2022-08-23 LAB — VITAMIN D 25 HYDROXY (VIT D DEFICIENCY, FRACTURES): VITD: 36.93 ng/mL (ref 30.00–100.00)

## 2022-08-23 LAB — TSH: TSH: 1.66 u[IU]/mL (ref 0.35–5.50)

## 2022-08-23 MED ORDER — VENLAFAXINE HCL ER 150 MG PO CP24: ORAL_CAPSULE | ORAL | 3 refills | Status: DC

## 2022-08-23 MED ORDER — AMLODIPINE BESYLATE 5 MG PO TABS: 5.0000 mg | ORAL_TABLET | Freq: Every day | ORAL | 3 refills | Status: DC

## 2022-08-23 NOTE — Progress Notes (Signed)
Subjective:    Jacqueline Finley is a 56 y.o. female and is here for a comprehensive physical exam.  HPI  Health Maintenance Due  Topic Date Due   COLONOSCOPY (Pts 45-36yr Insurance coverage will need to be confirmed)  Never done    Acute Concerns: No acute concerns discussed today.  Chronic Issues: Hypertension Patient reports that she regularly checks her blood pressure at home with good readings. She is complaint with her 5 mg norvasc and 25 mg Hydrodiuril.   Hypothyroidism Patient is complaint with her 112 mg Synthroid medication.  Depression Patient is complaint with her 150 mg Effexor-xr BID medication.  Health Maintenance: Mammogram -- last completed on 02/17/2021. PAP -- last completed on 10/24/2018. Bone Density -- last completed on 11/15/2014. Exercise -- Patient reports that she exercises 6 days a week.  Mood -- She is in a stable mood this visit.  UTD with dentist? - She is UTD on dental care. UTD with eye doctor? - She is UTD on vision care.  Weight history: Wt Readings from Last 10 Encounters:  08/23/22 144 lb (65.3 kg)  11/07/21 152 lb (68.9 kg)  09/29/21 151 lb 8 oz (68.7 kg)  08/12/21 168 lb 6.9 oz (76.4 kg)  07/31/21 168 lb 6.9 oz (76.4 kg)  06/30/21 163 lb 4 oz (74 kg)  10/24/18 155 lb 9.6 oz (70.6 kg)  10/18/17 160 lb 3.2 oz (72.7 kg)  04/26/17 156 lb 6.4 oz (70.9 kg)  10/20/14 169 lb 4.8 oz (76.8 kg)   Body mass index is 24.72 kg/m. No LMP recorded. Patient has had an ablation.  Alcohol use:  reports current alcohol use.  Tobacco use:  Tobacco Use: Low Risk  (08/23/2022)   Patient History    Smoking Tobacco Use: Never    Smokeless Tobacco Use: Never    Passive Exposure: Not on file   Eligible for lung cancer screening? no     08/23/2022    8:27 AM  Depression screen PHQ 2/9  Decreased Interest 0  Down, Depressed, Hopeless 0  PHQ - 2 Score 0     Other providers/specialists: Patient Care Team: WInda Coke PUtahas PCP -  General (Physician Assistant) CBuford Dresser MD as PCP - Cardiology (Cardiology)    PMHx, SurgHx, SocialHx, Medications, and Allergies were reviewed in the Visit Navigator and updated as appropriate.   Past Medical History:  Diagnosis Date   Acute angle-closure glaucoma 2015   from scopalamine patch   Depression    Hypothyroidism    PONV (postoperative nausea and vomiting)      Past Surgical History:  Procedure Laterality Date   CESAREAN SECTION     CYSTOSCOPY WITH RETROGRADE PYELOGRAM, URETEROSCOPY AND STENT PLACEMENT Left 07/31/2021   Procedure: CYSTOSCOPY WITH RETROGRADE PYELOGRAM, URETEROSCOPY AND STENT PLACEMENT;  Surgeon: WIrine Seal MD;  Location: [facility];  Service: Urology;  Laterality: Left;   INCISION AND DRAINAGE     due to C-section   IR RADIOLOGIST EVAL & MGMT  08/31/2021   ORIF FINGER FRACTURE Right    4th digit   PARATHYROIDECTOMY       Family History  Problem Relation Age of Onset   Uterine cancer Mother 487  Heart attack Father    Hypertension Father    Prostate cancer Father    Parkinson's disease Father    Colon cancer Neg Hx    Breast cancer Neg Hx     Social History   Tobacco Use   Smoking status: Never  Smokeless tobacco: Never  Vaping Use   Vaping Use: Never used  Substance Use Topics   Alcohol use: Yes    Comment: occ   Drug use: No    Review of Systems:   Review of Systems  Constitutional:  Negative for chills, fever, malaise/fatigue and weight loss.  HENT:  Negative for hearing loss, sinus pain and sore throat.   Respiratory:  Negative for cough and hemoptysis.   Cardiovascular:  Negative for chest pain, palpitations, leg swelling and PND.  Gastrointestinal:  Negative for abdominal pain, constipation, diarrhea, heartburn, nausea and vomiting.  Genitourinary:  Negative for dysuria, frequency and urgency.  Musculoskeletal:  Negative for back pain, myalgias and neck pain.  Skin:  Negative for itching and rash.   Endo/Heme/Allergies:  Negative for polydipsia.  Psychiatric/Behavioral:  Negative for depression. The patient is not nervous/anxious.     Objective:   BP 110/70 (BP Location: Left Arm, Patient Position: Sitting, Cuff Size: Normal)   Pulse 73   Temp 97.7 F (36.5 C) (Temporal)   Ht '5\' 4"'$  (1.626 m)   Wt 144 lb (65.3 kg)   SpO2 98%   BMI 24.72 kg/m  Body mass index is 24.72 kg/m.   General Appearance:    Alert, cooperative, no distress, appears stated age  Head:    Normocephalic, without obvious abnormality, atraumatic  Eyes:    PERRL, conjunctiva/corneas clear, EOM's intact, fundi    benign, both eyes  Ears:    Normal TM's and external ear canals, both ears  Nose:   Nares normal, septum midline, mucosa normal, no drainage    or sinus tenderness  Throat:   Lips, mucosa, and tongue normal; teeth and gums normal  Neck:   Supple, symmetrical, trachea midline, no adenopathy;    thyroid:  no enlargement/tenderness/nodules; no carotid   bruit or JVD  Back:     Symmetric, no curvature, ROM normal, no CVA tenderness  Lungs:     Clear to auscultation bilaterally, respirations unlabored  Chest Wall:    No tenderness or deformity   Heart:    Regular rate and rhythm, S1 and S2 normal, no murmur, rub or gallop  Breast Exam:    Deferred  Abdomen:     Soft, non-tender, bowel sounds active all four quadrants,    no masses, no organomegaly  Genitalia:    Deferred  Extremities:   Extremities normal, atraumatic, no cyanosis or edema  Pulses:   2+ and symmetric all extremities  Skin:   Skin color, texture, turgor normal, no rashes or lesions  Lymph nodes:   Cervical, supraclavicular, and axillary nodes normal  Neurologic:   CNII-XII intact, normal strength, sensation and reflexes    throughout    Assessment/Plan:   Routine physical examination Today patient counseled on age appropriate routine health concerns for screening and prevention, each reviewed and up to date or declined.  Immunizations reviewed and up to date or declined. Labs ordered and reviewed. Risk factors for depression reviewed and negative. Hearing function and visual acuity are intact. ADLs screened and addressed as needed. Functional ability and level of safety reviewed and appropriate. Education, counseling and referrals performed based on assessed risks today. Patient provided with a copy of personalized plan for preventive services.  Primary hypertension At goal Continue amlodopine 5 mg daily and HCTZ 25 mg daily Follow-up in 1 year, sooner if concerns  Vitamin D deficiency Update Vitamin D  Acquired hypothyroidism Update TSH and adjust synthroid 112 mcg daily as indicated  Special screening for malignant neoplasms, colon Update colonoscopy  I,Verona Buck,acting as a scribe for Inda Coke, PA.,have documented all relevant documentation on the behalf of Inda Coke, PA,as directed by  Inda Coke, PA while in the presence of Inda Coke, [place].  I, Inda Coke, [place], have reviewed all documentation for this visit. The documentation on 08/23/22 for the exam, diagnosis, procedures, and orders are all accurate and complete.  Inda Coke, PA-C [place]

## 2022-08-23 NOTE — Patient Instructions (Signed)
It was great to see you! ? ?Please go to the lab for blood work.  ? ?Our office will call you with your results unless you have chosen to receive results via MyChart. ? ?If your blood work is normal we will follow-up each year for physicals and as scheduled for chronic medical problems. ? ?If anything is abnormal we will treat accordingly and get you in for a follow-up. ? ?Take care, ? ?Jacqueline Finley ?  ? ? ?

## 2022-09-29 ENCOUNTER — Other Ambulatory Visit: Payer: Self-pay | Admitting: Physician Assistant

## 2022-10-18 ENCOUNTER — Encounter: Payer: Self-pay | Admitting: Physician Assistant

## 2022-10-18 MED ORDER — HYDROCHLOROTHIAZIDE 25 MG PO TABS: 25.0000 mg | ORAL_TABLET | Freq: Every day | ORAL | 1 refills | Status: DC

## 2022-11-23 DIAGNOSIS — J039 Acute tonsillitis, unspecified: Secondary | ICD-10-CM | POA: Insufficient documentation

## 2023-03-19 ENCOUNTER — Encounter: Payer: Self-pay | Admitting: Physician Assistant

## 2023-03-19 ENCOUNTER — Ambulatory Visit (INDEPENDENT_AMBULATORY_CARE_PROVIDER_SITE_OTHER): Payer: 59 | Admitting: Physician Assistant

## 2023-03-19 VITALS — BP 128/86 | HR 83 | Temp 97.8°F | Ht 64.0 in | Wt 134.5 lb

## 2023-03-19 DIAGNOSIS — E785 Hyperlipidemia, unspecified: Secondary | ICD-10-CM

## 2023-03-19 DIAGNOSIS — F32A Depression, unspecified: Secondary | ICD-10-CM

## 2023-03-19 DIAGNOSIS — E039 Hypothyroidism, unspecified: Secondary | ICD-10-CM

## 2023-03-19 DIAGNOSIS — I10 Essential (primary) hypertension: Secondary | ICD-10-CM

## 2023-03-19 DIAGNOSIS — F909 Attention-deficit hyperactivity disorder, unspecified type: Secondary | ICD-10-CM

## 2023-03-19 MED ORDER — AMLODIPINE BESYLATE 5 MG PO TABS: 5.0000 mg | ORAL_TABLET | Freq: Every day | ORAL | 3 refills | Status: DC

## 2023-03-19 MED ORDER — SYNTHROID 112 MCG PO TABS: ORAL_TABLET | ORAL | 3 refills | Status: DC

## 2023-03-19 MED ORDER — HYDROCHLOROTHIAZIDE 25 MG PO TABS: 25.0000 mg | ORAL_TABLET | Freq: Every day | ORAL | 3 refills | Status: DC

## 2023-03-19 MED ORDER — VENLAFAXINE HCL ER 150 MG PO CP24: 150.0000 mg | ORAL_CAPSULE | Freq: Two times a day (BID) | ORAL | 3 refills | Status: DC

## 2023-03-19 NOTE — Patient Instructions (Signed)
It was great to see you!  Let's follow-up in 4 months (virtual is fine) for ADHD follow-up, sooner if you have concerns.  Take care,  Jarold Motto PA-C

## 2023-03-19 NOTE — Progress Notes (Signed)
Jacqueline Finley is a 57 y.o. female here for a follow up of a pre-existing problem.  History of Present Illness:   Chief Complaint  Patient presents with   Medical Management of Chronic Issues    Hypertension, Hypothyroidism, needs refills    HPI  HTN: Her hypertension is well controlled.  She is currently taking amlodipine 5 mg daily and hydrochloroTHIAZIDE 25 mg daily. BP Readings from Last 3 Encounters:  03/19/23 128/86  08/23/22 110/70  11/07/21 126/84   Hypothyroidism: She is compliant with 112 mcg Synthroid and is requesting a refill.  Overall her hypothyroidism is well managed.  Lab Results  Component Value Date   TSH 1.66 08/23/2022   Depression: She is compliant with 150 mg Effexor-XR twice daily.  She sometimes will take 2 capsules of Effexor once a day, typically in the morning before work.  Denies SI/HI  ADHD: She has been managing her ADHD with 15 mg Adderall XR, twice daily. She tends to take it in the morning and at lunch.   Colonoscopy: She is overdue for colonoscopy.  She previously planned to take cologuard, but was unable to due to opening the package wrong.  Since then she did not reschedule. She is agreeable to scheduling a colonoscpy or cologuard.  Past Medical History:  Diagnosis Date   Acute angle-closure glaucoma 2015   from scopalamine patch   Depression    Hypothyroidism    PONV (postoperative nausea and vomiting)      Social History   Tobacco Use   Smoking status: Never   Smokeless tobacco: Never  Vaping Use   Vaping Use: Never used  Substance Use Topics   Alcohol use: Yes    Comment: occ   Drug use: No    Past Surgical History:  Procedure Laterality Date   CESAREAN SECTION     CYSTOSCOPY WITH RETROGRADE PYELOGRAM, URETEROSCOPY AND STENT PLACEMENT Left 07/31/2021   Procedure: CYSTOSCOPY WITH RETROGRADE PYELOGRAM, URETEROSCOPY AND STENT PLACEMENT;  Surgeon: Bjorn Pippin, MD;  Location: [facility];  Service: Urology;  Laterality:  Left;   INCISION AND DRAINAGE     due to C-section   IR RADIOLOGIST EVAL & MGMT  08/31/2021   ORIF FINGER FRACTURE Right    4th digit   PARATHYROIDECTOMY      Family History  Problem Relation Age of Onset   Uterine cancer Mother 60   Heart attack Father    Hypertension Father    Prostate cancer Father    Parkinson's disease Father    Colon cancer Neg Hx    Breast cancer Neg Hx     Allergies  Allergen Reactions   Morphine Nausea Only   Scopolamine Other (See Comments)    Accute angle closure glaucoma    Current Medications:   Current Outpatient Medications:    amphetamine-dextroamphetamine (ADDERALL XR) 15 MG 24 hr capsule, Take 15 mg by mouth 2 (two) times daily., Disp: , Rfl:    bimatoprost (LATISSE) 0.03 % ophthalmic solution, Place 1 application into both eyes See admin instructions. Apply topically along upper eyelid margin at base of eyelashes daily at bedtime, Disp: , Rfl:    amLODipine (NORVASC) 5 MG tablet, Take 1 tablet (5 mg total) by mouth daily., Disp: 90 tablet, Rfl: 3   hydrochlorothiazide (HYDRODIURIL) 25 MG tablet, Take 1 tablet (25 mg total) by mouth daily., Disp: 90 tablet, Rfl: 3   SYNTHROID 112 MCG tablet, TAKE 1 TABLET BY MOUTH EVERY MORNING ON AN EMPTY STOMACH WITH A GLASS  OF WATER AT LEAST 30 TO 60 MINUTES BEFORE BREAKFAST, Disp: 90 tablet, Rfl: 3   venlafaxine XR (EFFEXOR-XR) 150 MG 24 hr capsule, Take 1 capsule (150 mg total) by mouth 2 (two) times daily., Disp: 180 capsule, Rfl: 3   Review of Systems:   ROS Negative unless otherwise specified per HPI.  Vitals:   Vitals:   03/19/23 1410  BP: 128/86  Pulse: 83  Temp: 97.8 F (36.6 C)  TempSrc: Temporal  SpO2: 97%  Weight: 134 lb 8 oz (61 kg)  Height: 5\' 4"  (1.626 m)     Body mass index is 23.09 kg/m.  Physical Exam:   Physical Exam Vitals and nursing note reviewed.  Constitutional:      General: She is not in acute distress.    Appearance: She is well-developed. She is not  ill-appearing or toxic-appearing.  Cardiovascular:     Rate and Rhythm: Normal rate and regular rhythm.     Pulses: Normal pulses.     Heart sounds: Normal heart sounds, S1 normal and S2 normal.  Pulmonary:     Effort: Pulmonary effort is normal.     Breath sounds: Normal breath sounds.  Skin:    General: Skin is warm and dry.  Neurological:     Mental Status: She is alert.     GCS: GCS eye subscore is 4. GCS verbal subscore is 5. GCS motor subscore is 6.  Psychiatric:        Speech: Speech normal.        Behavior: Behavior normal. Behavior is cooperative.     Assessment and Plan:   Primary hypertension Normotensive Continue amlodipine 5 mg daily and hydrochloroTHIAZIDE 25 mg daily Follow-up in 6 month(s), sooner if concerns  Acquired hypothyroidism Update TSH and adjust levothyroxine 112 mcg daily as indicated  Depression, unspecified depression type Well controlled Continue Effexor 300 mg XR daily Denies SI/HI  Hyperlipidemia, unspecified hyperlipidemia type Update lipid panel and provide recommendations accordingly  Attention deficit hyperactivity disorder (ADHD), unspecified ADHD type Agreed to take over care She will message [place] when due for next Adderall refill and we will fill x 3 months and then require virtual visit to follow-up   I,Rachel Rivera,acting as a scribe for [employer], PA.,have documented all relevant documentation on the behalf of Jarold Motto, PA,as directed by  Jarold Motto, PA while in the presence of Jarold Motto, [place].  I, Jarold Motto, [place], have reviewed all documentation for this visit. The documentation on 03/19/23 for the exam, diagnosis, procedures, and orders are all accurate and complete.   Jarold Motto, PA-C

## 2023-03-20 ENCOUNTER — Other Ambulatory Visit (HOSPITAL_COMMUNITY): Payer: Self-pay

## 2023-03-20 LAB — COMPREHENSIVE METABOLIC PANEL
ALT: 29 U/L (ref 0–35)
AST: 26 U/L (ref 0–37)
Albumin: 4.3 g/dL (ref 3.5–5.2)
Alkaline Phosphatase: 48 U/L (ref 39–117)
BUN: 19 mg/dL (ref 6–23)
CO2: 25 mEq/L (ref 19–32)
Calcium: 9.7 mg/dL (ref 8.4–10.5)
Chloride: 101 mEq/L (ref 96–112)
Creatinine, Ser: 0.53 mg/dL (ref 0.40–1.20)
GFR: 101.47 mL/min (ref >60.00)
Glucose, Bld: 81 mg/dL (ref 70–99)
Potassium: 3.8 mEq/L (ref 3.5–5.1)
Sodium: 136 mEq/L (ref 135–145)
Total Bilirubin: 0.3 mg/dL (ref 0.2–1.2)
Total Protein: 7.1 g/dL (ref 6.0–8.3)

## 2023-03-20 LAB — CBC
HCT: 42.6 % (ref 36.0–46.0)
Hemoglobin: 13.7 g/dL (ref 12.0–15.0)
MCHC: 32.2 g/dL (ref 30.0–36.0)
MCV: 85 fl (ref 78.0–100.0)
Platelets: 361 10*3/uL (ref 150.0–400.0)
RBC: 5.01 Mil/uL (ref 3.87–5.11)
RDW: 13.5 % (ref 11.5–15.5)
WBC: 6.3 10*3/uL (ref 4.0–10.5)

## 2023-03-20 LAB — LIPID PANEL
Cholesterol: 221 mg/dL — ABNORMAL HIGH (ref 0–200)
HDL: 65.4 mg/dL (ref >39.00)
LDL Cholesterol: 121 mg/dL — ABNORMAL HIGH (ref 0–99)
NonHDL: 155.43
Total CHOL/HDL Ratio: 3
Triglycerides: 173 mg/dL — ABNORMAL HIGH (ref 0.0–149.0)
VLDL: 34.6 mg/dL (ref 0.0–40.0)

## 2023-03-20 LAB — TSH: TSH: 1.4 u[IU]/mL (ref 0.35–5.50)

## 2023-04-18 ENCOUNTER — Other Ambulatory Visit: Payer: Self-pay | Admitting: Physician Assistant

## 2023-04-18 MED ORDER — AMPHETAMINE-DEXTROAMPHET ER 15 MG PO CP24: 15.0000 mg | ORAL_CAPSULE | ORAL | 0 refills | Status: DC

## 2023-04-18 NOTE — Telephone Encounter (Signed)
Rx was already sent

## 2023-04-18 NOTE — Telephone Encounter (Signed)
Pt requesting refill for Adderall XR 15 mg capsule. Last OV 03/19/2023.

## 2023-04-23 ENCOUNTER — Other Ambulatory Visit: Payer: Self-pay | Admitting: Physician Assistant

## 2023-04-23 ENCOUNTER — Encounter: Payer: Self-pay | Admitting: Physician Assistant

## 2023-04-23 MED ORDER — AMPHETAMINE-DEXTROAMPHETAMINE 15 MG PO TABS: 15.0000 mg | ORAL_TABLET | Freq: Two times a day (BID) | ORAL | 0 refills | Status: DC

## 2023-04-23 MED ORDER — AMPHETAMINE-DEXTROAMPHETAMINE 15 MG PO TABS: ORAL_TABLET | ORAL | 0 refills | Status: DC

## 2023-04-23 NOTE — Telephone Encounter (Signed)
Please see message, pt is taking Adderall 15 mg BID not XR. Already picked up Adderall XR and just needs Adderall 15 mg for one month and then correct Rx's next month.

## 2023-07-01 ENCOUNTER — Encounter: Payer: Self-pay | Admitting: Physician Assistant

## 2023-07-01 ENCOUNTER — Ambulatory Visit (INDEPENDENT_AMBULATORY_CARE_PROVIDER_SITE_OTHER): Payer: 59 | Admitting: Physician Assistant

## 2023-07-01 VITALS — BP 130/80 | HR 70 | Temp 97.7°F | Ht 64.0 in | Wt 133.2 lb

## 2023-07-01 DIAGNOSIS — F909 Attention-deficit hyperactivity disorder, unspecified type: Secondary | ICD-10-CM

## 2023-07-01 DIAGNOSIS — I10 Essential (primary) hypertension: Secondary | ICD-10-CM

## 2023-07-01 DIAGNOSIS — Z1211 Encounter for screening for malignant neoplasm of colon: Secondary | ICD-10-CM

## 2023-07-01 MED ORDER — AMPHETAMINE-DEXTROAMPHETAMINE 15 MG PO TABS: 15.0000 mg | ORAL_TABLET | Freq: Two times a day (BID) | ORAL | 0 refills | Status: DC

## 2023-07-01 NOTE — Progress Notes (Signed)
Jacqueline Finley is a 57 y.o. female here for a follow up of a pre-existing problem.  History of Present Illness:   Chief Complaint  Patient presents with   ADHD    Pt needs refills for Adderall.    HPI  ADHD She reports compliance and good tolerance of Adderall 15 mg twice daily. She denies any symptoms at this time. She reports that she checks her blood pressure daily and states that it is not effected by her adderall.   HTN Currently taking amlodipine 5 mg and hydrochloroTHIAZIDE 25 mg. At home blood pressure readings are: wnl. Patient denies chest pain, SOB, blurred vision, dizziness, unusual headaches, lower leg swelling. Patient is compliant with medication. Denies excessive caffeine intake, stimulant usage, excessive alcohol intake, or increase in salt consumption.  BP Readings from Last 3 Encounters:  07/01/23 130/80  03/19/23 128/86  08/23/22 110/70     Past Medical History:  Diagnosis Date   Acute angle-closure glaucoma 2015   from scopalamine patch   Depression    Hypothyroidism    PONV (postoperative nausea and vomiting)      Social History   Tobacco Use   Smoking status: Never   Smokeless tobacco: Never  Vaping Use   Vaping status: Never Used  Substance Use Topics   Alcohol use: Yes    Comment: occ   Drug use: No    Past Surgical History:  Procedure Laterality Date   CESAREAN SECTION     CYSTOSCOPY WITH RETROGRADE PYELOGRAM, URETEROSCOPY AND STENT PLACEMENT Left 07/31/2021   Procedure: CYSTOSCOPY WITH RETROGRADE PYELOGRAM, URETEROSCOPY AND STENT PLACEMENT;  Surgeon: Bjorn Pippin, MD;  Location: [facility];  Service: Urology;  Laterality: Left;   INCISION AND DRAINAGE     due to C-section   IR RADIOLOGIST EVAL & MGMT  08/31/2021   ORIF FINGER FRACTURE Right    4th digit   PARATHYROIDECTOMY      Family History  Problem Relation Age of Onset   Uterine cancer Mother 53   Heart attack Father    Hypertension Father    Prostate cancer Father     Parkinson's disease Father    Colon cancer Neg Hx    Breast cancer Neg Hx     Allergies  Allergen Reactions   Morphine Nausea Only   Scopolamine Other (See Comments)    Accute angle closure glaucoma    Current Medications:   Current Outpatient Medications:    amLODipine (NORVASC) 5 MG tablet, Take 1 tablet (5 mg total) by mouth daily., Disp: 90 tablet, Rfl: 3   amphetamine-dextroamphetamine (ADDERALL) 15 MG tablet, Take 1 tablet by mouth 2 (two) times daily., Disp: 60 tablet, Rfl: 0   [START ON 07/31/2023] amphetamine-dextroamphetamine (ADDERALL) 15 MG tablet, Take 1 tablet by mouth 2 (two) times daily., Disp: 60 tablet, Rfl: 0   [START ON 08/30/2023] amphetamine-dextroamphetamine (ADDERALL) 15 MG tablet, Take 1 tablet by mouth 2 (two) times daily., Disp: 60 tablet, Rfl: 0   bimatoprost (LATISSE) 0.03 % ophthalmic solution, Place 1 application into both eyes See admin instructions. Apply topically along upper eyelid margin at base of eyelashes daily at bedtime, Disp: , Rfl:    hydrochlorothiazide (HYDRODIURIL) 25 MG tablet, Take 1 tablet (25 mg total) by mouth daily., Disp: 90 tablet, Rfl: 3   SYNTHROID 112 MCG tablet, TAKE 1 TABLET BY MOUTH EVERY MORNING ON AN EMPTY STOMACH WITH A GLASS OF WATER AT LEAST 30 TO 60 MINUTES BEFORE BREAKFAST, Disp: 90 tablet, Rfl: 3  venlafaxine XR (EFFEXOR-XR) 150 MG 24 hr capsule, Take 1 capsule (150 mg total) by mouth 2 (two) times daily., Disp: 180 capsule, Rfl: 3   Review of Systems:   ROS Negative unless otherwise specified per HPI.  Vitals:   Vitals:   07/01/23 0823  BP: 130/80  Pulse: 70  Temp: 97.7 F (36.5 C)  TempSrc: Temporal  SpO2: 99%  Weight: 133 lb 4 oz (60.4 kg)  Height: 5\' 4"  (1.626 m)     Body mass index is 22.87 kg/m.  Physical Exam:   Physical Exam Vitals and nursing note reviewed.  Constitutional:      General: She is not in acute distress.    Appearance: She is well-developed. She is not ill-appearing or  toxic-appearing.  Cardiovascular:     Rate and Rhythm: Normal rate and regular rhythm.     Pulses: Normal pulses.     Heart sounds: Normal heart sounds, S1 normal and S2 normal.  Pulmonary:     Effort: Pulmonary effort is normal.     Breath sounds: Normal breath sounds.  Skin:    General: Skin is warm and dry.  Neurological:     Mental Status: She is alert.     GCS: GCS eye subscore is 4. GCS verbal subscore is 5. GCS motor subscore is 6.  Psychiatric:        Speech: Speech normal.        Behavior: Behavior normal. Behavior is cooperative.     Assessment and Plan:   Primary hypertension Normotensive Continue amlodipine 5 mg and hydrochloroTHIAZIDE 25 mg Follow-up in 6 month(s), sooner if concerns  Attention deficit hyperactivity disorder (ADHD), unspecified ADHD type Well controlled Continue Adderall 15 mg twice daily PDMP without red flags Follow-up in 6 month(s), sooner if concerns  Special screening for malignant neoplasms, colon Update colonoscopy   I,Safa Kadhim, acting as a scribe for [employer], PA.,have documented all relevant documentation on the behalf of Jarold Motto, PA,as directed by  Jarold Motto, PA while in the presence of Jarold Motto, [place].  I, Jarold Motto, [place], have reviewed all documentation for this visit. The documentation on 07/01/23 for the exam, diagnosis, procedures, and orders are all accurate and complete.    Jarold Motto, PA-C

## 2023-07-10 ENCOUNTER — Encounter: Payer: Self-pay | Admitting: Physician Assistant

## 2023-07-10 ENCOUNTER — Other Ambulatory Visit: Payer: Self-pay | Admitting: Physician Assistant

## 2023-07-10 MED ORDER — AMPHETAMINE-DEXTROAMPHETAMINE 7.5 MG PO TABS: 15.0000 mg | ORAL_TABLET | Freq: Every day | ORAL | 0 refills | Status: DC

## 2023-07-10 MED ORDER — AMPHETAMINE-DEXTROAMPHETAMINE 7.5 MG PO TABS: 15.0000 mg | ORAL_TABLET | ORAL | 0 refills | Status: DC

## 2023-07-11 ENCOUNTER — Other Ambulatory Visit: Payer: Self-pay | Admitting: Physician Assistant

## 2023-07-11 ENCOUNTER — Encounter: Payer: Self-pay | Admitting: Internal Medicine

## 2023-07-11 MED ORDER — AMPHETAMINE-DEXTROAMPHETAMINE 7.5 MG PO TABS: 15.0000 mg | ORAL_TABLET | Freq: Two times a day (BID) | ORAL | 0 refills | Status: DC

## 2023-07-11 MED ORDER — AMPHETAMINE-DEXTROAMPHETAMINE 7.5 MG PO TABS
15.0000 mg | ORAL_TABLET | Freq: Two times a day (BID) | ORAL | 0 refills | Status: DC
Start: 2023-08-10 — End: 2023-07-31

## 2023-07-11 NOTE — Telephone Encounter (Signed)
Please see message. °

## 2023-07-31 ENCOUNTER — Ambulatory Visit (AMBULATORY_SURGERY_CENTER): Payer: 59

## 2023-07-31 VITALS — Ht 64.0 in | Wt 136.0 lb

## 2023-07-31 DIAGNOSIS — Z1211 Encounter for screening for malignant neoplasm of colon: Secondary | ICD-10-CM

## 2023-07-31 MED ORDER — NA SULFATE-K SULFATE-MG SULF 17.5-3.13-1.6 GM/177ML PO SOLN
1.0000 | Freq: Once | ORAL | 0 refills | Status: AC
Start: 2023-07-31 — End: 2023-07-31

## 2023-07-31 NOTE — Progress Notes (Signed)

## 2023-08-09 ENCOUNTER — Encounter: Payer: Self-pay | Admitting: Internal Medicine

## 2023-08-15 ENCOUNTER — Encounter: Payer: Self-pay | Admitting: Internal Medicine

## 2023-08-20 ENCOUNTER — Other Ambulatory Visit: Payer: Self-pay

## 2023-08-20 MED ORDER — NA SULFATE-K SULFATE-MG SULF 17.5-3.13-1.6 GM/177ML PO SOLN: ORAL | 0 refills | Status: DC

## 2023-08-28 ENCOUNTER — Encounter: Payer: Self-pay | Admitting: Internal Medicine

## 2023-08-28 ENCOUNTER — Ambulatory Visit: Payer: 59 | Admitting: Internal Medicine

## 2023-08-28 VITALS — BP 120/67 | HR 73 | Temp 97.9°F | Resp 14 | Ht 64.0 in | Wt 136.0 lb

## 2023-08-28 DIAGNOSIS — K648 Other hemorrhoids: Secondary | ICD-10-CM

## 2023-08-28 DIAGNOSIS — Z1211 Encounter for screening for malignant neoplasm of colon: Secondary | ICD-10-CM

## 2023-08-28 DIAGNOSIS — K635 Polyp of colon: Secondary | ICD-10-CM

## 2023-08-28 DIAGNOSIS — D125 Benign neoplasm of sigmoid colon: Secondary | ICD-10-CM

## 2023-08-28 DIAGNOSIS — D122 Benign neoplasm of ascending colon: Secondary | ICD-10-CM

## 2023-08-28 DIAGNOSIS — D128 Benign neoplasm of rectum: Secondary | ICD-10-CM | POA: Diagnosis not present

## 2023-08-28 DIAGNOSIS — D123 Benign neoplasm of transverse colon: Secondary | ICD-10-CM

## 2023-08-28 DIAGNOSIS — K5730 Diverticulosis of large intestine without perforation or abscess without bleeding: Secondary | ICD-10-CM | POA: Diagnosis not present

## 2023-08-28 MED ORDER — SODIUM CHLORIDE 0.9 % IV SOLN: 500.0000 mL | INTRAVENOUS | Status: DC

## 2023-08-28 NOTE — Progress Notes (Signed)
Sedate, gd SR, tolerated procedure well, VSS, report to RN 

## 2023-08-28 NOTE — Progress Notes (Signed)
Called to room to assist during endoscopic procedure.  Patient ID and intended procedure confirmed with present staff. Received instructions for my participation in the procedure from the performing physician.  

## 2023-08-28 NOTE — Op Note (Signed)
[facility] Patient Name: Jacqueline Finley Procedure Date: 08/28/2023 8:05 AM MRN: [id number] Endoscopist: Madelyn Brunner [place] , , [id number] Age: 57 Referring MD:  Date of Birth: August 15, 1966 Gender: Female Account #: [id number] Procedure:                Colonoscopy Indications:              Screening for colorectal malignant neoplasm, This                            is the patient's first colonoscopy Medicines:                Monitored Anesthesia Care Procedure:                Pre-Anesthesia Assessment:                           - Prior to the procedure, a History and Physical                            was performed, and patient medications and                            allergies were reviewed. The patient's tolerance of                            previous anesthesia was also reviewed. The risks                            and benefits of the procedure and the sedation                            options and risks were discussed with the patient.                            All questions were answered, and informed consent                            was obtained. Prior Anticoagulants: The patient has                            taken no anticoagulant or antiplatelet agents. ASA                            Grade Assessment: II - A patient with mild systemic                            disease. After reviewing the risks and benefits,                            the patient was deemed in satisfactory condition to                            undergo the procedure.  After obtaining informed consent, the colonoscope                            was passed under direct vision. Throughout the                            procedure, the patient's blood pressure, pulse, and                            oxygen saturations were monitored continuously. The                            Olympus Scope SN [id number] was introduced through the                            anus and  advanced to the the terminal ileum. The                            colonoscopy was performed without difficulty. The                            patient tolerated the procedure well. The quality                            of the bowel preparation was excellent. The                            terminal ileum, ileocecal valve, appendiceal                            orifice, and rectum were photographed. Scope In: 8:10:36 AM Scope Out: 8:37:27 AM Scope Withdrawal Time: 0 hours 16 minutes 45 seconds  Total Procedure Duration: 0 hours 26 minutes 51 seconds  Findings:                 The terminal ileum appeared normal.                           Four sessile polyps were found in the transverse                            colon and ascending colon. The polyps were 3 to 6                            mm in size. These polyps were removed with a cold                            snare. Resection and retrieval were complete.                           Three sessile polyps were found in the rectum and                            sigmoid colon. The  polyps were 3 to 5 mm in size.                            These polyps were removed with a cold snare.                            Resection and retrieval were complete.                           Multiple diverticula were found in the sigmoid                            colon.                           Non-bleeding internal hemorrhoids were found during                            retroflexion. Complications:            No immediate complications. Estimated Blood Loss:     Estimated blood loss was minimal. Impression:               - The examined portion of the ileum was normal.                           - Four 3 to 6 mm polyps in the transverse colon and                            in the ascending colon, removed with a cold snare.                            Resected and retrieved.                           - Three 3 to 5 mm polyps in the rectum and in the                             sigmoid colon, removed with a cold snare. Resected                            and retrieved.                           - Diverticulosis in the sigmoid colon.                           - Non-bleeding internal hemorrhoids. Recommendation:           - Discharge patient to home (with escort).                           - Await pathology results.                           - The findings and recommendations were discussed  with the patient. Dr Particia Lather "Alan Ripper" Leonides Schanz,  08/28/2023 8:43:01 AM

## 2023-08-28 NOTE — Patient Instructions (Signed)
Discharge instructions given. Handouts on polyps,Diverticulosis and Hemorrhoids. Resume previous medications. YOU HAD AN ENDOSCOPIC PROCEDURE TODAY AT [facility]:   Refer to the procedure report that was given to you for any specific questions about what was found during the examination.  If the procedure report does not answer your questions, please call your gastroenterologist to clarify.  If you requested that your care partner not be given the details of your procedure findings, then the procedure report has been included in a sealed envelope for you to review at your convenience later.  YOU SHOULD EXPECT: Some feelings of bloating in the abdomen. Passage of more gas than usual.  Walking can help get rid of the air that was put into your GI tract during the procedure and reduce the bloating. If you had a lower endoscopy (such as a colonoscopy or flexible sigmoidoscopy) you may notice spotting of blood in your stool or on the toilet paper. If you underwent a bowel prep for your procedure, you may not have a normal bowel movement for a few days.  Please Note:  You might notice some irritation and congestion in your nose or some drainage.  This is from the oxygen used during your procedure.  There is no need for concern and it should clear up in a day or so.  SYMPTOMS TO REPORT IMMEDIATELY:  Following lower endoscopy (colonoscopy or flexible sigmoidoscopy):  Excessive amounts of blood in the stool  Significant tenderness or worsening of abdominal pains  Swelling of the abdomen that is new, acute  Fever of 100F or higher   For urgent or emergent issues, a gastroenterologist can be reached at any hour by calling [phone]. Do not use MyChart messaging for urgent concerns.    DIET:  We do recommend a small meal at first, but then you may proceed to your regular diet.  Drink plenty of fluids but you should avoid alcoholic beverages for 24 hours.  ACTIVITY:  You should  plan to take it easy for the rest of today and you should NOT DRIVE or use heavy machinery until tomorrow (because of the sedation medicines used during the test).    FOLLOW UP: Our staff will call the number listed on your records the next business day following your procedure.  We will call around 7:15- 8:00 am to check on you and address any questions or concerns that you may have regarding the information given to you following your procedure. If we do not reach you, we will leave a message.     If any biopsies were taken you will be contacted by phone or by letter within the next 1-3 weeks.  Please call us at [phone] if you have not heard about the biopsies in 3 weeks.    SIGNATURES/CONFIDENTIALITY: You and/or your care partner have signed paperwork which will be entered into your electronic medical record.  These signatures attest to the fact that that the information above on your After Visit Summary has been reviewed and is understood.  Full responsibility of the confidentiality of this discharge information lies with you and/or your care-partner.

## 2023-08-28 NOTE — Progress Notes (Signed)
GASTROENTEROLOGY PROCEDURE H&P NOTE   Primary Care Physician: Jarold Motto, PA    Reason for Procedure:   Colon cancer screening  Plan:    Colonoscopy  Patient is appropriate for endoscopic procedure(s) in the ambulatory ([facility]) setting.  The nature of the procedure, as well as the risks, benefits, and alternatives were carefully and thoroughly reviewed with the patient. Ample time for discussion and questions allowed. The patient understood, was satisfied, and agreed to proceed.     HPI: Jacqueline Finley is a 58 y.o. female who presents for colonoscopy for colon cancer screening. Denies blood in stools, changes in bowel habits, or unintentional weight loss. Denies family history of colon cancer. This is her first colonoscopy.  Past Medical History:  Diagnosis Date   Allergy    Depression    Hypertension    Hypothyroidism    PONV (postoperative nausea and vomiting)     Past Surgical History:  Procedure Laterality Date   CESAREAN SECTION     CYSTOSCOPY WITH RETROGRADE PYELOGRAM, URETEROSCOPY AND STENT PLACEMENT Left 07/31/2021   Procedure: CYSTOSCOPY WITH RETROGRADE PYELOGRAM, URETEROSCOPY AND STENT PLACEMENT;  Surgeon: Bjorn Pippin, MD;  Location: [facility];  Service: Urology;  Laterality: Left;   INCISION AND DRAINAGE     due to C-section   IR RADIOLOGIST EVAL & MGMT  08/31/2021   ORIF FINGER FRACTURE Right    4th digit   PARATHYROIDECTOMY      Prior to Admission medications   Medication Sig Start Date End Date Taking? Authorizing Provider  amLODipine (NORVASC) 5 MG tablet Take 1 tablet (5 mg total) by mouth daily. 03/19/23  Yes Jarold Motto, PA  amphetamine-dextroamphetamine (ADDERALL) 7.5 MG tablet Take 2 tablets by mouth 2 (two) times daily. 09/09/23 10/09/23 Yes Jarold Motto, PA  hydrochlorothiazide (HYDRODIURIL) 25 MG tablet Take 1 tablet (25 mg total) by mouth daily. 03/19/23  Yes Worley, Lelon Mast, PA  SYNTHROID 112 MCG tablet TAKE 1 TABLET BY MOUTH EVERY  MORNING ON AN EMPTY STOMACH WITH A GLASS OF WATER AT LEAST 30 TO 60 MINUTES BEFORE BREAKFAST 03/19/23  Yes Jarold Motto, PA  venlafaxine XR (EFFEXOR-XR) 150 MG 24 hr capsule Take 1 capsule (150 mg total) by mouth 2 (two) times daily. 03/19/23  Yes Worley, Lelon Mast, PA  bimatoprost (LATISSE) 0.03 % ophthalmic solution Place 1 application into both eyes See admin instructions. Apply topically along upper eyelid margin at base of eyelashes daily at bedtime 06/09/21   [provider]    Current Outpatient Medications  Medication Sig Dispense Refill   amLODipine (NORVASC) 5 MG tablet Take 1 tablet (5 mg total) by mouth daily. 90 tablet 3   [START ON 09/09/2023] amphetamine-dextroamphetamine (ADDERALL) 7.5 MG tablet Take 2 tablets by mouth 2 (two) times daily. 120 tablet 0   hydrochlorothiazide (HYDRODIURIL) 25 MG tablet Take 1 tablet (25 mg total) by mouth daily. 90 tablet 3   SYNTHROID 112 MCG tablet TAKE 1 TABLET BY MOUTH EVERY MORNING ON AN EMPTY STOMACH WITH A GLASS OF WATER AT LEAST 30 TO 60 MINUTES BEFORE BREAKFAST 90 tablet 3   venlafaxine XR (EFFEXOR-XR) 150 MG 24 hr capsule Take 1 capsule (150 mg total) by mouth 2 (two) times daily. 180 capsule 3   bimatoprost (LATISSE) 0.03 % ophthalmic solution Place 1 application into both eyes See admin instructions. Apply topically along upper eyelid margin at base of eyelashes daily at bedtime     Current Facility-Administered Medications  Medication Dose Route Frequency Provider Last Rate Last Admin  0.9 %  sodium chloride infusion  500 mL Intravenous Continuous Imogene Burn, MD        Allergies as of 08/28/2023 - Review Complete 08/28/2023  Allergen Reaction Noted   Morphine Nausea Only 01/14/2015   Scopolamine Other (See Comments) 11/04/2014    Family History  Problem Relation Age of Onset   Uterine cancer Mother 4   Heart attack Father    Hypertension Father    Prostate cancer Father    Parkinson's disease Father    Colon  cancer Neg Hx    Breast cancer Neg Hx    Colon polyps Neg Hx    Esophageal cancer Neg Hx    Rectal cancer Neg Hx    Stomach cancer Neg Hx     Social History   Socioeconomic History   Marital status: Divorced    Spouse name: Not on file   Number of children: Not on file   Years of education: Not on file   Highest education level: Not on file  Occupational History   Occupation: CLINICAL DIETITIAN    Employer: [employer]  Tobacco Use   Smoking status: Never   Smokeless tobacco: Never  Vaping Use   Vaping status: Never Used  Substance and Sexual Activity   Alcohol use: Yes    Comment: occ   Drug use: No   Sexual activity: Yes    Birth control/protection: None  Other Topics Concern   Not on file  Social History Narrative   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 [employer]   Two children   Social Determinants of Health   Financial Resource Strain: Not on file  Food Insecurity: Not on file  Transportation Needs: Not on file  Physical Activity: Not on file  Stress: Not on file  Social Connections: Unknown (11/21/2022)   Received from [facility], [facility]   Social Network    Social Network: Not on file  Intimate Partner Violence: Unknown (11/21/2022)   Received from [facility], [facility]   HITS    Physically Hurt: Not on file    Insult or Talk Down To: Not on file    Threaten Physical Harm: Not on file    Scream or Curse: Not on file    Physical Exam: Vital signs in last 24 hours: BP 105/64   Pulse 79   Temp 97.9 F (36.6 C) (Temporal)   Ht 5\' 4"  (1.626 m)   Wt 136 lb (61.7 kg)   SpO2 99%   BMI 23.34 kg/m  GEN: NAD EYE: Sclerae anicteric ENT: MMM CV: Non-tachycardic Pulm: No increased work of breathing GI: Soft, NT/ND NEURO:  Alert & Oriented   Eulah Pont, MD [facility]  08/28/2023 8:02 AM

## 2023-08-29 ENCOUNTER — Telehealth: Payer: Self-pay

## 2023-08-29 NOTE — Telephone Encounter (Signed)
  Follow up Call-     08/28/2023    7:25 AM  Call back number  Post procedure Call Back phone  # [phone]  Permission to leave phone message Yes    Left message

## 2023-08-30 LAB — SURGICAL PATHOLOGY

## 2023-08-31 ENCOUNTER — Encounter: Payer: Self-pay | Admitting: Internal Medicine

## 2023-10-08 ENCOUNTER — Ambulatory Visit (INDEPENDENT_AMBULATORY_CARE_PROVIDER_SITE_OTHER): Payer: 59 | Admitting: Family Medicine

## 2023-10-08 ENCOUNTER — Encounter: Payer: Self-pay | Admitting: Family Medicine

## 2023-10-08 ENCOUNTER — Ambulatory Visit: Payer: 59 | Admitting: Physician Assistant

## 2023-10-08 VITALS — BP 151/91 | HR 73 | Temp 97.7°F | Ht 64.0 in | Wt 138.6 lb

## 2023-10-08 DIAGNOSIS — J069 Acute upper respiratory infection, unspecified: Secondary | ICD-10-CM | POA: Diagnosis not present

## 2023-10-08 DIAGNOSIS — H6011 Cellulitis of right external ear: Secondary | ICD-10-CM

## 2023-10-08 MED ORDER — NEOMYCIN-POLYMYXIN-HC 3.5-10000-1 OT SOLN: 3.0000 [drp] | Freq: Three times a day (TID) | OTIC | 0 refills | Status: DC

## 2023-10-08 MED ORDER — DOXYCYCLINE HYCLATE 100 MG PO TABS
100.0000 mg | ORAL_TABLET | Freq: Two times a day (BID) | ORAL | 0 refills | Status: AC
Start: 2023-10-08 — End: 2023-10-15

## 2023-10-08 NOTE — Progress Notes (Signed)
 Subjective  CC:  Chief Complaint  Patient presents with   Ear Pain    Pt stated that she has been experiencing some Rt ear pain since 10/02/2023. There were times that she could not even touch the inside of her ear. Now, it is less painful on the outside but still no better.     Same day acute visit; PCP not available. New pt to me. Chart reviewed.  HPI: Jacqueline Finley is a 58 y.o. female who presents to the office today to address the problems listed above in the chief complaint. Discussed the use of AI scribe software for clinical note transcription with the patient, who gave verbal consent to proceed.  History of Present Illness   The patient presented with a one-week history of ear pain, which has been progressively worsening. She reported no prior history of similar symptoms or ear infections. The patient described the pain as severe enough to cause discomfort when touching the ear. She attempted self-treatment with over-the-counter ear drops, which provided minimal relief.  In the last two days, the patient started to feel generally unwell, experiencing symptoms such as a mild sore throat, fatigue, and headache. She also reported dizziness severe enough to interfere with her regular exercise routine. The patient noted the presence of a scab in the ear, which she removed, leading to some improvement in the pain.  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 She has been experiencing a sensation of clogged ears and is worried about the possibility of an impending illness. However, she has not reported any other symptoms suggestive of an upper respiratory tract infection.  The patient is not on any prescription medications and has no known allergies. She has not reported any changes in her medication regimen or the use of any new products, except for a tighter headband during workouts. The patient is keen on resolving the ear  issue promptly as she needs to continue working from home.      Assessment  1. Cellulitis of auricle of right ear   2. Viral upper respiratory tract infection      Plan  Cellulitis of tragus and EAC:  doxy and cortisporin otic and advil. To recheck in 2 weeks to ensure resolution of pustule (ensure no persistent skin lesion is there) Monitor for worsening URI and treat supportively.   Follow up: prn Visit date not found  No orders of the defined types were placed in this encounter.  Meds ordered this encounter  Medications   neomycin -polymyxin-hydrocortisone (CORTISPORIN) OTIC solution    Sig: Place 3 drops into the right ear 3 (three) times daily.    Dispense:  10 mL    Refill:  0   doxycycline  (VIBRA -TABS) 100 MG tablet    Sig: Take 1 tablet (100 mg total) by mouth 2 (two) times daily for 7 days.    Dispense:  14 tablet    Refill:  0      I reviewed the patients updated PMH, FH, and SocHx.    Patient Active Problem List   Diagnosis Date Noted   Renal abscess, left 08/12/2021   Klebsiella pneumoniae infection 08/12/2021   Sepsis due to Klebsiella pneumoniae with no resultant organ failure [facility]) 08/03/2021   Left ureteral stone 08/02/2021   Follicular cystitis 08/02/2021   Hypokalemia 08/01/2021   Hyponatremia 08/01/2021   Gastroesophageal reflux disease 08/01/2021   Hypoalbuminemia 08/01/2021   Pyelonephritis 07/31/2021   Hypertension 06/30/2021  Closed fracture of proximal phalanx of digit of left hand 06/12/2021   S/P parathyroidectomy 01/20/2015   Primary hyperparathyroidism (HCC) 11/04/2014   Nuclear cataract of right eye 11/03/2014   Depression 10/20/2014   Hypothyroid 10/20/2014   Residual stage of angle-closure glaucoma of right eye 11/15/2013   Current Meds  Medication Sig   amLODipine  (NORVASC ) 5 MG tablet Take 1 tablet (5 mg total) by mouth daily.   amphetamine -dextroamphetamine  (ADDERALL) 7.5 MG tablet Take 2 tablets by mouth 2 (two) times daily.    bimatoprost  (LATISSE ) 0.03 % ophthalmic solution Place 1 application into both eyes See admin instructions. Apply topically along upper eyelid margin at base of eyelashes daily at bedtime   doxycycline  (VIBRA -TABS) 100 MG tablet Take 1 tablet (100 mg total) by mouth 2 (two) times daily for 7 days.   hydrochlorothiazide  (HYDRODIURIL ) 25 MG tablet Take 1 tablet (25 mg total) by mouth daily.   neomycin -polymyxin-hydrocortisone (CORTISPORIN) OTIC solution Place 3 drops into the right ear 3 (three) times daily.   SYNTHROID  112 MCG tablet TAKE 1 TABLET BY MOUTH EVERY MORNING ON AN EMPTY STOMACH WITH A GLASS OF WATER AT LEAST 30 TO 60 MINUTES BEFORE BREAKFAST   venlafaxine  XR (EFFEXOR -XR) 150 MG 24 hr capsule Take 1 capsule (150 mg total) by mouth 2 (two) times daily.    Allergies: Patient is allergic to morphine  and scopolamine. Family History: Patient family history includes Heart attack in her father; Hypertension in her father; Parkinson's disease in her father; Prostate cancer in her father; Uterine cancer (age of onset: 38) in her mother. Social History:  Patient  reports that she has never smoked. She has never used smokeless tobacco. She reports current alcohol use. She reports that she does not use drugs.  Review of Systems: Constitutional: Negative for fever malaise or anorexia Cardiovascular: negative for chest pain Respiratory: negative for SOB or persistent cough Gastrointestinal: negative for abdominal pain  Objective  Vitals: BP (!) 151/91 Comment: machine  Pulse 73   Temp 97.7 F (36.5 C)   Ht 5' 4 (1.626 m)   Wt 138 lb 9.6 oz (62.9 kg)   SpO2 98%   BMI 23.79 kg/m  General: no acute distress , A&Ox3 HEENT: PEERL, conjunctiva normal, neck is supple, right tragus is red and swollen and ttp, EAC with medial pustule visible and ttp. TM is normal.  Cardiovascular:  RRR without murmur or gallop.  Respiratory:  Good breath sounds bilaterally, CTAB with normal respiratory  effort Skin:  Warm, no rashes  Commons side effects, risks, benefits, and alternatives for medications and treatment plan prescribed today were discussed, and the patient expressed understanding of the given instructions. Patient is instructed to call or message via MyChart if he/she has any questions or concerns regarding our treatment plan. No barriers to understanding were identified. We discussed Red Flag symptoms and signs in detail. Patient expressed understanding regarding what to do in case of urgent or emergency type symptoms.  Medication list was reconciled, printed and provided to the patient in AVS. Patient instructions and summary information was reviewed with the patient as documented in the AVS. This note was prepared with assistance of Dragon voice recognition software. Occasional wrong-word or sound-a-like substitutions may have occurred due to the inherent limitations of voice recognition software

## 2023-10-08 NOTE — Patient Instructions (Signed)
 Please follow up if symptoms do not improve or as needed.    VISIT SUMMARY:  You came in today with a one-week history of ear pain that has been getting worse. You also reported feeling generally unwell with symptoms like a mild sore throat, fatigue, headache, and dizziness. 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 You have not had any similar symptoms or ear infections in the past.  YOUR PLAN:  -EXTERNAL EAR INFECTION WITH LESION: An external ear infection is an infection of the outer ear canal, often caused by bacteria or fungi. You have a small infected lesion in your ear, possibly due to bacterial entry into the canal from trauma. We will treat this with an oral antibiotic (doxycycline ) and ear drops with steroids to reduce inflammation and pain. You can also take ibuprofen for pain management. Please follow up with your primary care doctor or an ENT specialist if your symptoms persist or worsen.  -POSSIBLE VIRAL UPPER RESPIRATORY INFECTION: A viral upper respiratory infection is a common illness that affects the nose, throat, and airways, often causing symptoms like sore throat, fatigue, and headache. Your symptoms suggest you might have this type of infection. We recommend treating your symptoms with over-the-counter medications as needed and monitoring your condition. If your symptoms get worse, please seek further medical attention.  INSTRUCTIONS:  Please take the prescribed oral antibiotic and use the ear drops with steroids as directed. Use ibuprofen for pain management. Monitor your symptoms and seek further medical attention if they worsen. Follow up with your primary care doctor or an ENT specialist if your ear symptoms persist or worsen.

## 2023-11-26 ENCOUNTER — Encounter: Payer: Self-pay | Admitting: Physician Assistant

## 2023-11-26 ENCOUNTER — Ambulatory Visit (INDEPENDENT_AMBULATORY_CARE_PROVIDER_SITE_OTHER): Payer: 59 | Admitting: Physician Assistant

## 2023-11-26 VITALS — BP 126/80 | HR 88 | Temp 97.3°F | Ht 64.0 in | Wt 140.5 lb

## 2023-11-26 DIAGNOSIS — Z23 Encounter for immunization: Secondary | ICD-10-CM

## 2023-11-26 DIAGNOSIS — F909 Attention-deficit hyperactivity disorder, unspecified type: Secondary | ICD-10-CM

## 2023-11-26 DIAGNOSIS — I10 Essential (primary) hypertension: Secondary | ICD-10-CM

## 2023-11-26 MED ORDER — AMPHETAMINE-DEXTROAMPHETAMINE 7.5 MG PO TABS: 15.0000 mg | ORAL_TABLET | Freq: Two times a day (BID) | ORAL | 0 refills | Status: DC

## 2023-11-26 NOTE — Progress Notes (Signed)
 Jacqueline Finley is a 58 y.o. female here for a follow up of a pre-existing problem.  History of Present Illness:   Chief Complaint  Patient presents with   Hypertension    Pt has been checking Bp at home 131/80 this morning.   ADD    Needs refill    HPI  ADHD She reports compliance and good tolerance of Adderall 15 mg twice daily. Has been experiencing increased stress due to work.   Denies significant insomnia or worsening anxiety due to medications.  HTN Currently taking amlodipine 5 mg and hydrochlorothiazide 5 mg.  At home blood pressure readings have been ranging in the 130's/80's range. No concerns or symptoms at this time.   Past Medical History:  Diagnosis Date   Allergy    Depression    Hypertension    Hypothyroidism    PONV (postoperative nausea and vomiting)      Social History   Tobacco Use   Smoking status: Never   Smokeless tobacco: Never  Vaping Use   Vaping status: Never Used  Substance Use Topics   Alcohol use: Yes    Comment: occ   Drug use: No    Past Surgical History:  Procedure Laterality Date   CESAREAN SECTION     CYSTOSCOPY WITH RETROGRADE PYELOGRAM, URETEROSCOPY AND STENT PLACEMENT Left 07/31/2021   Procedure: CYSTOSCOPY WITH RETROGRADE PYELOGRAM, URETEROSCOPY AND STENT PLACEMENT;  Surgeon: Bjorn Pippin, MD;  Location: [facility];  Service: Urology;  Laterality: Left;   INCISION AND DRAINAGE     due to C-section   IR RADIOLOGIST EVAL & MGMT  08/31/2021   ORIF FINGER FRACTURE Right    4th digit   PARATHYROIDECTOMY      Family History  Problem Relation Age of Onset   Uterine cancer Mother 58   Heart attack Father    Hypertension Father    Prostate cancer Father    Parkinson's disease Father    Colon cancer Neg Hx    Breast cancer Neg Hx    Colon polyps Neg Hx    Esophageal cancer Neg Hx    Rectal cancer Neg Hx    Stomach cancer Neg Hx     Allergies  Allergen Reactions   Morphine Nausea Only   Scopolamine Other (See Comments)     Accute angle closure glaucoma    Current Medications:   Current Outpatient Medications:    amLODipine (NORVASC) 5 MG tablet, Take 1 tablet (5 mg total) by mouth daily., Disp: 90 tablet, Rfl: 3   bimatoprost (LATISSE) 0.03 % ophthalmic solution, Place 1 application into both eyes See admin instructions. Apply topically along upper eyelid margin at base of eyelashes daily at bedtime, Disp: , Rfl:    fluorouracil (EFUDEX) 5 % cream, Apply 1 Application topically daily. For 21 days, Disp: , Rfl:    hydrochlorothiazide (HYDRODIURIL) 25 MG tablet, Take 1 tablet (25 mg total) by mouth daily., Disp: 90 tablet, Rfl: 3   SYNTHROID 112 MCG tablet, TAKE 1 TABLET BY MOUTH EVERY MORNING ON AN EMPTY STOMACH WITH A GLASS OF WATER AT LEAST 30 TO 60 MINUTES BEFORE BREAKFAST, Disp: 90 tablet, Rfl: 3   venlafaxine XR (EFFEXOR-XR) 150 MG 24 hr capsule, Take 1 capsule (150 mg total) by mouth 2 (two) times daily., Disp: 180 capsule, Rfl: 3   amphetamine-dextroamphetamine (ADDERALL) 7.5 MG tablet, Take 2 tablets by mouth 2 (two) times daily., Disp: 120 tablet, Rfl: 0   Review of Systems:   ROS Negative unless otherwise  specified per HPI.  Vitals:   Vitals:   11/26/23 0758  BP: 126/80  Pulse: 88  Temp: (!) 97.3 F (36.3 C)  TempSrc: Temporal  SpO2: 99%  Weight: 140 lb 8 oz (63.7 kg)  Height: 5\' 4"  (1.626 m)     Body mass index is 24.12 kg/m.  Physical Exam:   Physical Exam Vitals and nursing note reviewed.  Constitutional:      General: She is not in acute distress.    Appearance: She is well-developed. She is not ill-appearing or toxic-appearing.  Cardiovascular:     Rate and Rhythm: Normal rate and regular rhythm.     Pulses: Normal pulses.     Heart sounds: Normal heart sounds, S1 normal and S2 normal.  Pulmonary:     Effort: Pulmonary effort is normal.     Breath sounds: Normal breath sounds.  Skin:    General: Skin is warm and dry.  Neurological:     Mental Status: She is alert.      GCS: GCS eye subscore is 4. GCS verbal subscore is 5. GCS motor subscore is 6.  Psychiatric:        Speech: Speech normal.        Behavior: Behavior normal. Behavior is cooperative.     Assessment and Plan:   Primary hypertension Normotensive Continue amlodipine 5 mg daily Follow-up in 6 month(s), sooner if concerns  Attention deficit hyperactivity disorder (ADHD), unspecified ADHD type Well controlled Continue 15 mg Adderall twice daily  PDMP reviewed during this encounter. Follow-up in 3 month(s), sooner if concerns   Jarold Motto, PA-C  I,Safa M Kadhim,acting as a scribe for Jarold Motto, PA.,have documented all relevant documentation on the behalf of Jarold Motto, PA,as directed by  Jarold Motto, PA while in the presence of Jarold Motto, [place].   I, Jarold Motto, [place], have reviewed all documentation for this visit. The documentation on 11/26/23 for the exam, diagnosis, procedures, and orders are all accurate and complete.

## 2023-11-26 NOTE — Addendum Note (Signed)
 Addended by: Jimmye Norman on: 11/26/2023 08:34 AM   Modules accepted: Orders

## 2024-01-06 ENCOUNTER — Telehealth: Payer: Self-pay | Admitting: Physician Assistant

## 2024-01-06 NOTE — Telephone Encounter (Unsigned)
 Copied from CRM [id number]. Topic: General - Other >> Jan 06, 2024  3:56 PM Adrionna Y wrote: Reason for CRM: Patient is calling in because normally 3 prexcriptions are sent in for her adderall. This time only one was sent in. So pharmacy states to fix this just call in the difference. 1 prescription for 2 tablets a day  Twice a day for may until appointment on 02/07/2024

## 2024-01-07 ENCOUNTER — Other Ambulatory Visit: Payer: Self-pay | Admitting: Physician Assistant

## 2024-01-07 MED ORDER — AMPHETAMINE-DEXTROAMPHETAMINE 7.5 MG PO TABS: 15.0000 mg | ORAL_TABLET | Freq: Two times a day (BID) | ORAL | 0 refills | Status: DC

## 2024-01-07 NOTE — Telephone Encounter (Signed)
 Left detailed message on personal voicemail, Ova Bloomer has sent your prescription to the pharmacy for the next two months. Please make sure you keep your appt. Have a good day.  Jerilyn Monte, LPN

## 2024-01-07 NOTE — Telephone Encounter (Signed)
 Pt requesting refill for Adderall 7.5 mg. Appt scheduled 5/16.

## 2024-01-31 ENCOUNTER — Encounter (HOSPITAL_COMMUNITY): Payer: Self-pay

## 2024-02-07 ENCOUNTER — Encounter: Payer: Self-pay | Admitting: Family Medicine

## 2024-02-07 ENCOUNTER — Ambulatory Visit (INDEPENDENT_AMBULATORY_CARE_PROVIDER_SITE_OTHER): Admitting: Family Medicine

## 2024-02-07 VITALS — BP 132/82 | HR 74 | Temp 97.7°F | Ht 64.0 in | Wt 137.0 lb

## 2024-02-07 DIAGNOSIS — F909 Attention-deficit hyperactivity disorder, unspecified type: Secondary | ICD-10-CM

## 2024-02-07 MED ORDER — AMPHETAMINE-DEXTROAMPHETAMINE 7.5 MG PO TABS: 15.0000 mg | ORAL_TABLET | Freq: Two times a day (BID) | ORAL | 0 refills | Status: DC

## 2024-02-07 NOTE — Patient Instructions (Signed)
 Please return in 49mo for ADD follow up.  I have sent in your Adderall prescriptions.   If you have any questions or concerns, please don't hesitate to send me a message via MyChart or call the office at [phone]. Thank you for visiting with us  today! It's our pleasure caring for you.

## 2024-02-07 NOTE — Progress Notes (Signed)
 Subjective  CC:  Chief Complaint  Patient presents with   ADHD    HPI: Jacqueline Finley is a 58 y.o. female who presents to the office today to address the problems listed above in the chief complaint.  ADD/ADHD  follow up:  She is taking medication as directed and continues to feel it is beneficial. The medications continue to help with focus and attention and task completion. She denies adverse side effects; specifically no headaches, appetite suppression, weight loss, sleeping difficulty, heart palpitations, chest pain or significant weight changes.  Assessment  1. Attention deficit hyperactivity disorder (ADHD), unspecified ADHD type      Plan  ADD:  refilled meds. No changes.  I reviewed patient's records from the PMP aware controlled substance registry today.    Follow up: 3 mo w/ pcp  No orders of the defined types were placed in this encounter.  No orders of the defined types were placed in this encounter.     I reviewed the patients updated PMH, FH, and SocHx.    Patient Active Problem List   Diagnosis Date Noted   Acute tonsillitis 11/23/2022   Renal abscess, left 08/12/2021   Klebsiella pneumoniae infection 08/12/2021   Sepsis due to Klebsiella pneumoniae with no resultant organ failure [facility]) 08/03/2021   Left ureteral stone 08/02/2021   Follicular cystitis 08/02/2021   Hypokalemia 08/01/2021   Hyponatremia 08/01/2021   Gastroesophageal reflux disease 08/01/2021   Hypoalbuminemia 08/01/2021   Pyelonephritis 07/31/2021   Hypertension 06/30/2021   Closed fracture of proximal phalanx of digit of left hand 06/12/2021   S/P parathyroidectomy 01/20/2015   Primary hyperparathyroidism (HCC) 11/04/2014   Nuclear cataract of right eye 11/03/2014   Depression 10/20/2014   Hypothyroid 10/20/2014   Residual stage of angle-closure glaucoma of right eye 11/15/2013   Current Meds  Medication Sig   amLODipine  (NORVASC ) 5 MG tablet Take 1 tablet (5 mg total) by mouth  daily.   amphetamine -dextroamphetamine  (ADDERALL) 7.5 MG tablet Take 2 tablets by mouth 2 (two) times daily.   bimatoprost  (LATISSE ) 0.03 % ophthalmic solution Place 1 application into both eyes See admin instructions. Apply topically along upper eyelid margin at base of eyelashes daily at bedtime   fluorouracil (EFUDEX) 5 % cream Apply 1 Application topically daily. For 21 days   hydrochlorothiazide  (HYDRODIURIL ) 25 MG tablet Take 1 tablet (25 mg total) by mouth daily.   SYNTHROID  112 MCG tablet TAKE 1 TABLET BY MOUTH EVERY MORNING ON AN EMPTY STOMACH WITH A GLASS OF WATER AT LEAST 30 TO 60 MINUTES BEFORE BREAKFAST   venlafaxine  XR (EFFEXOR -XR) 150 MG 24 hr capsule Take 1 capsule (150 mg total) by mouth 2 (two) times daily.    Allergies: Patient is allergic to morphine  and scopolamine. Family History: Patient family history includes Heart attack in her father; Hypertension in her father; Parkinson's disease in her father; Prostate cancer in her father; Uterine cancer (age of onset: 68) in her mother. Social History:  Patient  reports that she has never smoked. She has never used smokeless tobacco. She reports current alcohol use. She reports that she does not use drugs.  Review of Systems: Constitutional: Negative for fever malaise or anorexia Cardiovascular: negative for chest pain Respiratory: negative for SOB or persistent cough Gastrointestinal: negative for abdominal pain  Objective  Vitals: BP 132/82   Pulse 74   Temp 97.7 F (36.5 C)   Ht 5\' 4"  (1.626 m)   Wt 137 lb (62.1 kg)   SpO2 98%  BMI 23.52 kg/m  General: no acute distress , A&Ox3    Commons side effects, risks, benefits, and alternatives for medications and treatment plan prescribed today were discussed, and the patient expressed understanding of the given instructions. Patient is instructed to call or message via MyChart if he/she has any questions or concerns regarding our treatment plan. No barriers to  understanding were identified. We discussed Red Flag symptoms and signs in detail. Patient expressed understanding regarding what to do in case of urgent or emergency type symptoms.  Medication list was reconciled, printed and provided to the patient in AVS. Patient instructions and summary information was reviewed with the patient as documented in the AVS. This note was prepared with assistance of Dragon voice recognition software. Occasional wrong-word or sound-a-like substitutions may have occurred due to the inherent limitations of voice recognition software

## 2024-03-11 ENCOUNTER — Other Ambulatory Visit: Payer: Self-pay | Admitting: Physician Assistant

## 2024-03-18 ENCOUNTER — Other Ambulatory Visit: Payer: Self-pay | Admitting: Physician Assistant

## 2024-03-18 ENCOUNTER — Other Ambulatory Visit: Payer: Self-pay | Admitting: *Deleted

## 2024-03-18 MED ORDER — VENLAFAXINE HCL ER 150 MG PO CP24: 150.0000 mg | ORAL_CAPSULE | Freq: Two times a day (BID) | ORAL | 0 refills | Status: DC

## 2024-03-29 ENCOUNTER — Other Ambulatory Visit: Payer: Self-pay | Admitting: Physician Assistant

## 2024-05-05 ENCOUNTER — Ambulatory Visit: Admitting: Physician Assistant

## 2024-05-06 ENCOUNTER — Encounter: Payer: Self-pay | Admitting: Physician Assistant

## 2024-05-06 ENCOUNTER — Ambulatory Visit: Admitting: Physician Assistant

## 2024-05-06 VITALS — BP 118/80 | HR 73 | Temp 97.5°F | Ht 64.0 in | Wt 145.0 lb

## 2024-05-06 DIAGNOSIS — F909 Attention-deficit hyperactivity disorder, unspecified type: Secondary | ICD-10-CM

## 2024-05-06 DIAGNOSIS — I10 Essential (primary) hypertension: Secondary | ICD-10-CM | POA: Diagnosis not present

## 2024-05-06 DIAGNOSIS — E039 Hypothyroidism, unspecified: Secondary | ICD-10-CM | POA: Diagnosis not present

## 2024-05-06 LAB — COMPREHENSIVE METABOLIC PANEL WITH GFR
ALT: 26 U/L (ref 0–35)
AST: 23 U/L (ref 0–37)
Albumin: 4.6 g/dL (ref 3.5–5.2)
Alkaline Phosphatase: 44 U/L (ref 39–117)
BUN: 24 mg/dL — ABNORMAL HIGH (ref 6–23)
CO2: 29 meq/L (ref 19–32)
Calcium: 10 mg/dL (ref 8.4–10.5)
Chloride: 100 meq/L (ref 96–112)
Creatinine, Ser: 0.59 mg/dL (ref 0.40–1.20)
GFR: 98.09 mL/min (ref >60.00)
Glucose, Bld: 93 mg/dL (ref 70–99)
Potassium: 4.6 meq/L (ref 3.5–5.1)
Sodium: 137 meq/L (ref 135–145)
Total Bilirubin: 0.3 mg/dL (ref 0.2–1.2)
Total Protein: 7.9 g/dL (ref 6.0–8.3)

## 2024-05-06 LAB — CBC WITH DIFFERENTIAL/PLATELET
Basophils Absolute: 0 K/uL (ref 0.0–0.1)
Basophils Relative: 0.5 % (ref 0.0–3.0)
Eosinophils Absolute: 0.2 K/uL (ref 0.0–0.7)
Eosinophils Relative: 2.7 % (ref 0.0–5.0)
HCT: 43.5 % (ref 36.0–46.0)
Hemoglobin: 14.3 g/dL (ref 12.0–15.0)
Lymphocytes Relative: 32.4 % (ref 12.0–46.0)
Lymphs Abs: 1.8 K/uL (ref 0.7–4.0)
MCHC: 32.9 g/dL (ref 30.0–36.0)
MCV: 85.3 fl (ref 78.0–100.0)
Monocytes Absolute: 0.5 K/uL (ref 0.1–1.0)
Monocytes Relative: 8.6 % (ref 3.0–12.0)
Neutro Abs: 3.2 K/uL (ref 1.4–7.7)
Neutrophils Relative %: 55.8 % (ref 43.0–77.0)
Platelets: 354 K/uL (ref 150.0–400.0)
RBC: 5.1 Mil/uL (ref 3.87–5.11)
RDW: 13.1 % (ref 11.5–15.5)
WBC: 5.7 K/uL (ref 4.0–10.5)

## 2024-05-06 LAB — TSH: TSH: 1.48 u[IU]/mL (ref 0.35–5.50)

## 2024-05-06 MED ORDER — AMPHETAMINE-DEXTROAMPHETAMINE 7.5 MG PO TABS: 15.0000 mg | ORAL_TABLET | Freq: Two times a day (BID) | ORAL | 0 refills | Status: DC

## 2024-05-06 NOTE — Progress Notes (Signed)
 Jacqueline Finley is a 58 y.o. female here for a follow up of a pre-existing problem.  History of Present Illness:   Chief Complaint  Patient presents with   Medication Refill    Adderrall; no other concerns    Discussed the use of AI scribe software for clinical note transcription with the patient, who gave verbal consent to proceed.  History of Present Illness Jaimi Belle is a 58 year old female with ADHD who presents for medication management.  Her current ADHD medication regimen (Adderall 15 mg twice daily) is stable and effective, with no concerns about the medications themselves. She uses a pharmacy not covered by her insurance due to issues with her preferred pharmacy, but finds the current arrangement manageable.  She is taking synthroid  112 mcg daily and tolerating well. Has not had TSH checked in 1 year. Agreeable to checking today.  Currently taking amlodipine  5 mg daily, hydrochloroTHIAZIDE  25 mg daily. At home blood pressure readings are: normal. Patient denies chest pain, SOB, blurred vision, dizziness, unusual headaches, lower leg swelling. Patient is compliant with medication. Denies excessive caffeine intake, stimulant usage, excessive alcohol intake, or increase in salt consumption.  BP Readings from Last 3 Encounters:  05/06/24 118/80  02/07/24 132/82  11/26/23 126/80         Past Medical History:  Diagnosis Date   Allergy    Depression    Hypertension    Hypothyroidism    PONV (postoperative nausea and vomiting)      Social History   Tobacco Use   Smoking status: Never   Smokeless tobacco: Never  Vaping Use   Vaping status: Never Used  Substance Use Topics   Alcohol use: Yes    Comment: occ   Drug use: No    Past Surgical History:  Procedure Laterality Date   CESAREAN SECTION     CYSTOSCOPY WITH RETROGRADE PYELOGRAM, URETEROSCOPY AND STENT PLACEMENT Left 07/31/2021   Procedure: CYSTOSCOPY WITH RETROGRADE PYELOGRAM, URETEROSCOPY AND STENT  PLACEMENT;  Surgeon: Watt Rush, MD;  Location: [facility];  Service: Urology;  Laterality: Left;   INCISION AND DRAINAGE     due to C-section   IR RADIOLOGIST EVAL & MGMT  08/31/2021   ORIF FINGER FRACTURE Right    4th digit   PARATHYROIDECTOMY      Family History  Problem Relation Age of Onset   Uterine cancer Mother 21   Heart attack Father    Hypertension Father    Prostate cancer Father    Parkinson's disease Father    Colon cancer Neg Hx    Breast cancer Neg Hx    Colon polyps Neg Hx    Esophageal cancer Neg Hx    Rectal cancer Neg Hx    Stomach cancer Neg Hx     Allergies  Allergen Reactions   Morphine  Nausea Only   Scopolamine Other (See Comments)    Accute angle closure glaucoma    Current Medications:   Current Outpatient Medications:    amLODipine  (NORVASC ) 5 MG tablet, Take 1 tablet (5 mg total) by mouth daily., Disp: 90 tablet, Rfl: 3   amphetamine -dextroamphetamine  (ADDERALL) 7.5 MG tablet, Take 2 tablets by mouth 2 (two) times daily., Disp: 120 tablet, Rfl: 0   [START ON 06/05/2024] amphetamine -dextroamphetamine  (ADDERALL) 7.5 MG tablet, Take 2 tablets by mouth 2 (two) times daily., Disp: 120 tablet, Rfl: 0   [START ON 07/05/2024] amphetamine -dextroamphetamine  (ADDERALL) 7.5 MG tablet, Take 2 tablets by mouth 2 (two) times daily., Disp: 120 tablet,  Rfl: 0   bimatoprost  (LATISSE ) 0.03 % ophthalmic solution, Place 1 application into both eyes See admin instructions. Apply topically along upper eyelid margin at base of eyelashes daily at bedtime, Disp: , Rfl:    fluorouracil (EFUDEX) 5 % cream, Apply 1 Application topically daily. For 21 days, Disp: , Rfl:    hydrochlorothiazide  (HYDRODIURIL ) 25 MG tablet, TAKE 1 TABLET BY MOUTH DAILY, Disp: 90 tablet, Rfl: 3   SYNTHROID  112 MCG tablet, TAKE 1 TABLET BY MOUTH EVERY MORNING ON AN EMPTY STOMACH WITH A GLASS OF WATER AT LEAST 30 TO 60 MINUTES BEFORE BREAKFAST, Disp: 90 tablet, Rfl: 3   venlafaxine  XR (EFFEXOR -XR) 150 MG  24 hr capsule, Take 1 capsule (150 mg total) by mouth 2 (two) times daily., Disp: 180 capsule, Rfl: 0   Review of Systems:   Negative unless otherwise specified per HPI.  Vitals:   Vitals:   05/06/24 0937  BP: 118/80  Pulse: 73  Temp: (!) 97.5 F (36.4 C)  SpO2: 98%  Weight: 145 lb (65.8 kg)  Height: 5' 4 (1.626 m)     Body mass index is 24.89 kg/m.  Physical Exam:   Physical Exam Vitals and nursing note reviewed.  Constitutional:      General: She is not in acute distress.    Appearance: She is well-developed. She is not ill-appearing or toxic-appearing.  Cardiovascular:     Rate and Rhythm: Normal rate and regular rhythm.     Pulses: Normal pulses.     Heart sounds: Normal heart sounds, S1 normal and S2 normal.  Pulmonary:     Effort: Pulmonary effort is normal.     Breath sounds: Normal breath sounds.  Skin:    General: Skin is warm and dry.  Neurological:     Mental Status: She is alert.     GCS: GCS eye subscore is 4. GCS verbal subscore is 5. GCS motor subscore is 6.  Psychiatric:        Speech: Speech normal.        Behavior: Behavior normal. Behavior is cooperative.     Assessment and Plan:   Assessment and Plan Assessment & Plan Attention-deficit hyperactivity disorder (ADHD) ADHD management is well-managed with current medication regimen. - Continue Amphetamine -dextroamphetamine  (Adderall) 15 MG oral twice daily. Follow up in 3 month(s), sooner if concerns  Primary hypertension Blood pressure is well-controlled with current medication regimen. - Continue Amlodipine  (Norvasc ) 5 MG oral daily. - Continue Hydrochlorothiazide  (Hydrodiuril ) 25 MG oral daily. Follow up in 1 year, sooner if concerns  Hypothyroidism Currently taking Synthroid  112 mcg daily Follow up in 1 year, sooner if TSH abnormal or other concerns      Lucie Buttner, PA-C

## 2024-05-07 ENCOUNTER — Ambulatory Visit: Payer: Self-pay | Admitting: Physician Assistant

## 2024-05-13 ENCOUNTER — Ambulatory Visit: Admitting: Physician Assistant

## 2024-06-04 ENCOUNTER — Other Ambulatory Visit: Payer: Self-pay

## 2024-06-10 ENCOUNTER — Other Ambulatory Visit: Payer: Self-pay | Admitting: Physician Assistant

## 2024-06-22 ENCOUNTER — Other Ambulatory Visit: Payer: Self-pay | Admitting: *Deleted

## 2024-06-22 MED ORDER — VENLAFAXINE HCL ER 150 MG PO CP24: 150.0000 mg | ORAL_CAPSULE | Freq: Two times a day (BID) | ORAL | 1 refills | Status: AC

## 2024-07-06 ENCOUNTER — Other Ambulatory Visit: Payer: Self-pay

## 2024-08-07 ENCOUNTER — Ambulatory Visit (INDEPENDENT_AMBULATORY_CARE_PROVIDER_SITE_OTHER): Admitting: Physician Assistant

## 2024-08-07 ENCOUNTER — Encounter: Payer: Self-pay | Admitting: Physician Assistant

## 2024-08-07 VITALS — BP 118/80 | HR 84 | Temp 98.2°F | Ht 64.0 in | Wt 142.2 lb

## 2024-08-07 DIAGNOSIS — F909 Attention-deficit hyperactivity disorder, unspecified type: Secondary | ICD-10-CM

## 2024-08-07 DIAGNOSIS — E039 Hypothyroidism, unspecified: Secondary | ICD-10-CM

## 2024-08-07 DIAGNOSIS — I10 Essential (primary) hypertension: Secondary | ICD-10-CM

## 2024-08-07 MED ORDER — AMPHETAMINE-DEXTROAMPHETAMINE 7.5 MG PO TABS: 15.0000 mg | ORAL_TABLET | Freq: Two times a day (BID) | ORAL | 0 refills | Status: DC

## 2024-08-07 NOTE — Progress Notes (Signed)
 Jacqueline Finley is a 58 y.o. female here for a follow up of a pre-existing problem.  History of Present Illness:   Chief Complaint  Patient presents with   ADHD    Pt here for 3 month f/u, currently taking Adderall 7.5 mg 2 daily.    Discussed the use of AI scribe software for clinical note transcription with the patient, who gave verbal consent to proceed.  History of Present Illness   Tahtiana Rozier is a 58 year old female with hypertension, hypercalciuria, and thyroid  disorder who presents for medication management and follow-up.  She manages hypertension with amlodipine  and hydrochlorothiazide . Hypercalciuria is also treated with hydrochlorothiazide . She is considering switching from Synthroid  to levothyroxine  due to cost concerns. Her TSH levels are stable and will be re-evaluated next year. She maintains a healthy diet with adequate fruit and vegetable intake but aims to increase protein consumption. She is physically active, participating in running and cycling. She experiences work-related stress due to increased security measures affecting computer access.     She is currently taking 15 mg Adderall XR twice daily.  Past Medical History:  Diagnosis Date   Allergy    Depression    Hypertension    Hypothyroidism    PONV (postoperative nausea and vomiting)      Social History   Tobacco Use   Smoking status: Never   Smokeless tobacco: Never  Vaping Use   Vaping status: Never Used  Substance Use Topics   Alcohol use: Yes    Comment: occ   Drug use: No    Past Surgical History:  Procedure Laterality Date   CESAREAN SECTION     CYSTOSCOPY WITH RETROGRADE PYELOGRAM, URETEROSCOPY AND STENT PLACEMENT Left 07/31/2021   Procedure: CYSTOSCOPY WITH RETROGRADE PYELOGRAM, URETEROSCOPY AND STENT PLACEMENT;  Surgeon: Watt Rush, MD;  Location: [facility];  Service: Urology;  Laterality: Left;   INCISION AND DRAINAGE     due to C-section   IR RADIOLOGIST EVAL & MGMT  08/31/2021   ORIF  FINGER FRACTURE Right    4th digit   PARATHYROIDECTOMY      Family History  Problem Relation Age of Onset   Uterine cancer Mother 55   Heart attack Father    Hypertension Father    Prostate cancer Father    Parkinson's disease Father    Colon cancer Neg Hx    Breast cancer Neg Hx    Colon polyps Neg Hx    Esophageal cancer Neg Hx    Rectal cancer Neg Hx    Stomach cancer Neg Hx     Allergies  Allergen Reactions   Morphine  Nausea Only   Scopolamine Other (See Comments)    Accute angle closure glaucoma    Current Medications:   Current Outpatient Medications:    amLODipine  (NORVASC ) 5 MG tablet, TAKE 1 TABLET BY MOUTH DAILY, Disp: 90 tablet, Rfl: 3   amphetamine -dextroamphetamine  (ADDERALL) 7.5 MG tablet, Take 2 tablets by mouth 2 (two) times daily., Disp: 120 tablet, Rfl: 0   [START ON 09/06/2024] amphetamine -dextroamphetamine  (ADDERALL) 7.5 MG tablet, Take 2 tablets by mouth 2 (two) times daily., Disp: 120 tablet, Rfl: 0   [START ON 10/06/2024] amphetamine -dextroamphetamine  (ADDERALL) 7.5 MG tablet, Take 2 tablets by mouth 2 (two) times daily., Disp: 120 tablet, Rfl: 0   bimatoprost  (LATISSE ) 0.03 % ophthalmic solution, Place 1 application into both eyes See admin instructions. Apply topically along upper eyelid margin at base of eyelashes daily at bedtime, Disp: , Rfl:  fluorouracil (EFUDEX) 5 % cream, Apply 1 Application topically daily. For 21 days, Disp: , Rfl:    hydrochlorothiazide  (HYDRODIURIL ) 25 MG tablet, TAKE 1 TABLET BY MOUTH DAILY, Disp: 90 tablet, Rfl: 3   SYNTHROID  112 MCG tablet, TAKE 1 TABLET BY MOUTH EVERY MORNING ON AN EMPTY STOMACH WITH A GLASS OF WATER AT LEAST 30 TO 60 MINUTES BEFORE BREAKFAST, Disp: 90 tablet, Rfl: 3   venlafaxine  XR (EFFEXOR -XR) 150 MG 24 hr capsule, Take 1 capsule (150 mg total) by mouth 2 (two) times daily., Disp: 180 capsule, Rfl: 1   Review of Systems:   Negative unless otherwise specified per HPI.  Vitals:   Vitals:    08/07/24 0826  BP: 118/80  Pulse: 84  Temp: 98.2 F (36.8 C)  TempSrc: Temporal  SpO2: 99%  Weight: 142 lb 4 oz (64.5 kg)  Height: 5' 4 (1.626 m)     Body mass index is 24.42 kg/m.  Physical Exam:   Physical Exam Vitals and nursing note reviewed.  Constitutional:      General: She is not in acute distress.    Appearance: Normal appearance. She is well-developed. She is not ill-appearing or toxic-appearing.  HENT:     Head: Normocephalic and atraumatic.  Eyes:     General: Lids are normal.     Extraocular Movements: Extraocular movements intact.     Conjunctiva/sclera: Conjunctivae normal.  Cardiovascular:     Rate and Rhythm: Normal rate and regular rhythm.     Pulses: Normal pulses.     Heart sounds: Normal heart sounds, S1 normal and S2 normal.  Pulmonary:     Effort: Pulmonary effort is normal.     Breath sounds: Normal breath sounds.  Musculoskeletal:        General: Normal range of motion.     Cervical back: Normal range of motion and neck supple.  Skin:    General: Skin is warm and dry.  Neurological:     Mental Status: She is alert and oriented to person, place, and time.     GCS: GCS eye subscore is 4. GCS verbal subscore is 5. GCS motor subscore is 6.  Psychiatric:        Attention and Perception: Attention and perception normal.        Mood and Affect: Mood normal.        Speech: Speech normal.        Behavior: Behavior normal. Behavior is cooperative.        Thought Content: Thought content normal.        Judgment: Judgment normal.     Assessment and Plan:   Assessment and Plan    Hypothyroidism Well-managed with Synthroid . Considering switch to levothyroxine  due to cost. - Continue Synthroid  112 mcg daily. - Check levothyroxine  pricing at [facility]. - Ensure Synthroid  refills.  Essential hypertension Blood pressure control satisfactory with amlodipine  and hydrochlorothiazide . - Continue amlodipine  5 and hydrochlorothiazide  25 as  prescribed.  Attention Deficit Hyperactivity Disorder (ADHD) Well controlled Continue Adderall 15 mg twice daily  Prescription drug monitoring program without red flags Follow up in 3 month(s)         Lucie Buttner, PA-C

## 2024-10-23 ENCOUNTER — Other Ambulatory Visit: Payer: Self-pay | Admitting: Physician Assistant

## 2024-10-23 ENCOUNTER — Telehealth: Payer: Self-pay | Admitting: *Deleted

## 2024-10-23 MED ORDER — AMPHETAMINE-DEXTROAMPHETAMINE 7.5 MG PO TABS: 15.0000 mg | ORAL_TABLET | Freq: Two times a day (BID) | ORAL | 0 refills | Status: AC

## 2024-10-23 NOTE — Telephone Encounter (Signed)
 Pt requesting refill for Adderall 7.5 mg tablet. Last OV 08/07/2024.

## 2024-10-23 NOTE — Telephone Encounter (Signed)
 Copied from CRM [id number]. Topic: Clinical - Medication Refill >> Oct 23, 2024  3:17 PM Alfonso HERO wrote: Medication: amphetamine -dextroamphetamine  (ADDERALL) 7.5 MG table  Has the patient contacted their pharmacy? Yes (Agent: If no, request that the patient contact the pharmacy for the refill. If patient does not wish to contact the pharmacy document the reason why and proceed with request.) (Agent: If yes, when and what did the pharmacy advise?)  This is the patient's preferred pharmacy:  [facility] [id number] - [place], [place] - [address] [place] [address] Phone: [phone] Fax: [phone]   Is this the correct pharmacy for this prescription? Yes If no, delete pharmacy and type the correct one.   Has the prescription been filled recently? Yes  Is the patient out of the medication? Yes  Has the patient been seen for an appointment in the last year OR does the patient have an upcoming appointment? Yes  Can we respond through MyChart? Yes  Agent: Please be advised that Rx refills may take up to 3 business days. We ask that you follow-up with your pharmacy.

## 2024-11-18 ENCOUNTER — Encounter: Admitting: Physician Assistant

## 2024-12-16 ENCOUNTER — Encounter: Admitting: Physician Assistant
